# Patient Record
Sex: Male | Born: 1960 | Race: White | Hispanic: No | Marital: Married | Smoking: Never smoker
Health system: Southern US, Community
[De-identification: ages and names within clinical notes are randomized; demographics above are authoritative.]

## PROBLEM LIST (undated history)

## (undated) DIAGNOSIS — K5090 Crohn's disease, unspecified, without complications: Secondary | ICD-10-CM

## (undated) DIAGNOSIS — K5190 Ulcerative colitis, unspecified, without complications: Secondary | ICD-10-CM

## (undated) HISTORY — PX: TOTAL SHOULDER REPLACEMENT: SUR1217

## (undated) HISTORY — PX: ACHILLES TENDON SURGERY: SHX542

## (undated) HISTORY — PX: TOTAL HIP ARTHROPLASTY: SHX124

---

## 2000-09-27 ENCOUNTER — Other Ambulatory Visit: Admission: RE | Admit: 2000-09-27 | Discharge: 2000-09-27 | Payer: Self-pay | Admitting: Orthopedic Surgery

## 2001-02-06 ENCOUNTER — Encounter: Payer: Self-pay | Admitting: Internal Medicine

## 2001-02-06 ENCOUNTER — Encounter: Admission: RE | Admit: 2001-02-06 | Discharge: 2001-02-06 | Payer: Self-pay | Admitting: Internal Medicine

## 2007-02-01 ENCOUNTER — Encounter: Admission: RE | Admit: 2007-02-01 | Discharge: 2007-02-01 | Payer: Self-pay | Admitting: *Deleted

## 2007-02-02 ENCOUNTER — Inpatient Hospital Stay (HOSPITAL_COMMUNITY): Admission: AD | Admit: 2007-02-02 | Discharge: 2007-02-06 | Payer: Self-pay | Admitting: Internal Medicine

## 2007-07-04 ENCOUNTER — Encounter: Admission: RE | Admit: 2007-07-04 | Discharge: 2007-07-04 | Payer: Self-pay | Admitting: Gastroenterology

## 2007-10-06 ENCOUNTER — Emergency Department (HOSPITAL_COMMUNITY): Admission: EM | Admit: 2007-10-06 | Discharge: 2007-10-06 | Payer: Self-pay | Admitting: Emergency Medicine

## 2007-10-10 ENCOUNTER — Inpatient Hospital Stay (HOSPITAL_COMMUNITY): Admission: AD | Admit: 2007-10-10 | Discharge: 2007-10-14 | Payer: Self-pay | Admitting: Gastroenterology

## 2007-10-11 ENCOUNTER — Encounter (INDEPENDENT_AMBULATORY_CARE_PROVIDER_SITE_OTHER): Payer: Self-pay | Admitting: Gastroenterology

## 2007-12-27 ENCOUNTER — Encounter: Admission: RE | Admit: 2007-12-27 | Discharge: 2007-12-27 | Payer: Self-pay | Admitting: Gastroenterology

## 2010-03-06 ENCOUNTER — Encounter: Admission: RE | Admit: 2010-03-06 | Discharge: 2010-03-06 | Payer: Self-pay | Admitting: Gastroenterology

## 2010-07-12 ENCOUNTER — Encounter: Payer: Self-pay | Admitting: *Deleted

## 2010-10-02 ENCOUNTER — Ambulatory Visit
Admission: RE | Admit: 2010-10-02 | Discharge: 2010-10-02 | Disposition: A | Discharge status: Home / Self Care | Payer: BLUE CROSS/BLUE SHIELD | Source: Ambulatory Visit | Attending: Family Medicine | Admitting: Family Medicine

## 2010-10-02 ENCOUNTER — Other Ambulatory Visit: Payer: Self-pay | Admitting: Family Medicine

## 2010-10-02 DIAGNOSIS — J019 Acute sinusitis, unspecified: Secondary | ICD-10-CM

## 2010-11-03 NOTE — Op Note (Signed)
NAME:  [name], [name]                ACCOUNT NO.:  [id number]   MEDICAL RECORD NO.:  [id number]          PATIENT TYPE:  INP   LOCATION:  5509                         FACILITY:  [facility]   PHYSICIAN:  James L. Malon Kindle., M.D.DATE OF BIRTH:  06/19/1961   DATE OF PROCEDURE:  10/13/2007  DATE OF DISCHARGE:                               OPERATIVE REPORT   PROCEDURE:  Esophagogastroduodenoscopy.   MEDICATIONS:  1. Fentanyl 75 mcg.  2. Versed 7.5 mg.  3. Cetacaine spray.   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  He is on 6-MP steroids and had persistent nausea.  This is  done to evaluate for ulcer, inflammatory bowel disease in the upper  track etc.   DESCRIPTION OF PROCEDURE:  The procedure explained to the patient,  consent obtained. In the left lateral decubitus position.  Endoscope  inserted and advanced with agglutination.  Esophagus completely  endoscopically normal.  No ulceration, inflammation, minimal hiatal  hernia, no Barrett esophageus.  Stomach entered.  Pores was identified  and passed, mild duodenitis of the duodenal bulb, and slight  inflammation of the pores, otherwise normal.  The stomach was normal.  The antrum and body, the fundus and cardia were seen on the retroflex  view and were normal.  The scope was withdrawn and initial findings were  confirmed.   ASSESSMENT:  1. Nausea, without obvious cause found on endoscopy.  2. Mild duodenitis, we will continue on PPI's and advanced diet,      hopefully we will be able to get him home soon.           ______________________________  Llana Aliment Malon Kindle., M.D.     Waldron Session  D:  10/13/2007  T:  10/14/2007  Job:  [id number]   cc:   Bernette Redbird, M.D.  Loreli Dollar, MD

## 2010-11-03 NOTE — Discharge Summary (Signed)
[name], [name]                ACCOUNT NO.:  [id number]   MEDICAL RECORD NO.:  [id number]          PATIENT TYPE:  INP   LOCATION:  6731                         FACILITY:  [facility]   PHYSICIAN:  Kela Millin, M.D.DATE OF BIRTH:  1960/12/08   DATE OF ADMISSION:  02/02/2007  DATE OF DISCHARGE:  02/06/2007                               DISCHARGE SUMMARY   DISCHARGE DIAGNOSES:  1. Infectious enteritis versus pouchitis - resolving, per      gastroenterology.  2. History of ulcerative colitis - status post colectomy.   CONSULTATIONS:  Gastroenterology - Dr. Bosie Clos and Dr. Matthias Hughs   PROCEDURES AND STUDIES:  CT scan of abdomen and pelvis - wall thickening  identified in the distal small bowel at the level of the pelvic  anastomosis.  A stricture at this level can not be excluded.  There is  some proximal small bowel distention, although contrast is seen through  to the level of the pelvic anastomosis, this may be related to a degree  of ileus, although obstruction at the distal anastomosis secondary to a  distal stricture is a possibility.  Mild mesenteric adenopathy in the  anatomic pelvis may be reactive.   BRIEF HISTORY:  The patient is a 50 year old white male with the above-  listed medical problems and status post total colectomy in 1994, who  presented with complaints of abdominal pain.  He reported that he had  recently been out to [place] backpacking, and following that  developed abdominal pain, diarrhea and an episode of vomiting with  chills.  After he returned from his trip, he saw his primary care  physician, and a CT scan of the abdomen and pelvis were done, and the  results as stated above, he was admitted for further evaluation and  management.   Please see the patient's admission history and physical of February 02, 2007, for full details of the admission physical exam.   LABORATORY DATA:  On admission, revealed a white cell count of 5.1,  hemoglobin of  14.1, hematocrit 41.6, platelet count of 439, sodium 135,  potassium 4.4, chloride of 103, CO2 of 27, glucose of 98, BUN of 10,  creatinine of 0.99.   HOSPITAL COURSE:  1. Abdominal  pain, probably secondary to infectious gastroenteritis      versus pouchitis - The patient had been empirically started on oral      Cipro and Flagyl by his primary care physician, and a CT scan of      his abdomen obtained, and the results as stated above.  Upon      admission, the patient was maintained on Cipro and Flagyl IV, and      gastroenterology was consulted, and Dr. Bosie Clos saw the patient,      and his impression was that this was likely secondary to an      infectious gastroenteritis with a slight ileus.  Stool studies were      ordered, and Giardia screen is negative, and also the      Cryptosporidium screen is negative.  Gastroenterology followed the  patient and agreed with the Cipro and the Flagyl.  The patient's      symptoms have resolved, he is not having any further abdominal      pain.  He is tolerating p.o. well, and he is also not having any      further diarrhea.  GI saw the patient on rounds today and      recommended that he could be discharged home, and the patient      strongly desires to go home at this time, and he will be discharged      to follow up with Dr. Matthias Hughs on February 13, 2007.  He is also to      follow up with his primary care physician in 1 to 2 weeks.  2. History of ulcerative colitis - status post colectomy in 1994.  Dr.      Bosie Clos saw the patient and indicated that there is a question as      to if this is actually ulcerative colitis or Crohn's.   She is to follow up with gastroenterology, as indicated above.   DISCHARGE MEDICATIONS:  1. Cipro 500 mg p.o. b.i.d.  2. Flagyl 500 mg one p.o. t.i.d.  3. The patient has been instructed to hold off Imodium and Celebrex,      until he follows up with gastroenterology.   DISCHARGE CONDITION:   Improved/stable.   FOLLOW-UP CARE:  1. Dr. Matthias Hughs on Monday, August 25th at 9:30 a.m.  2. Dr. Flonnie Overman in 1 to 2 weeks.  Patient to call for appointment.      Kela Millin, M.D.  Electronically Signed     ACV/MEDQ  D:  02/06/2007  T:  02/06/2007  Job:  [id number]   cc:   Lucita Ferrara, MD  Bernette Redbird, M.D.

## 2010-11-03 NOTE — H&P (Signed)
NAMEZEDEKIAH, HINDERMAN                ACCOUNT NO.:  [id number]   MEDICAL RECORD NO.:  [id number]          PATIENT TYPE:  INP   LOCATION:  6731                         FACILITY:  [facility]   PHYSICIAN:  Kela Millin, M.D.DATE OF BIRTH:  [birth year]   DATE OF ADMISSION:  02/02/2007  DATE OF DISCHARGE:                              HISTORY & PHYSICAL   His primary care physician is Dr. Flonnie Overman.   CHIEF COMPLAINT:  Abdominal pain and abnormal CT scan per PCP.   HISTORY OF PRESENT ILLNESS:  The patient is a 50 year old white male  with past medical history significant for ulcerative colitis status post  colectomy in 1994 (at [facility]) who presents with complaints of abdominal  pain times 1 week.  He states that his symptoms started after he went  backpacking in [place].  He reports that initially he had some  associated diarrhea, about 5 stools a day, but that has resolved.  He  describes the abdominal as sharp and constant but periodically gets more  intense, usually 7-8 out of 10 in intensity, and goes up to 10.  He  reports that the pain is usually with about 30-40 minutes after meals.  He admits to nausea and just had 1 episode of vomiting in the one week.  He states that even when he had the diarrhea it was nonbloody.  He  admits to subjective fevers.  He denies cough, shortness of breath,  dysuria, melena, and no hematochezia.  The patient was seen by his  primary care physician and empirically started on Cipro and Flagyl on  the day prior to admission.  A CT scan of the abdomen was also ordered  and, per PCP, a stricture cannot be excluded and also question  obstruction.  He is directly admitted to the hospital for further  evaluation and management.   PAST MEDICAL HISTORY:  As above.   MEDICATIONS:  Celebrex and Imodium.   ALLERGIES:  PENICILLIN AND IBUPROFEN.   SOCIAL HISTORY:  He denies tobacco.  Also denies alcohol.   FAMILY HISTORY:  Noncontributory.   REVIEW OF SYSTEMS:  As  per HPI.   PHYSICAL EXAM:  GENERAL:  The patient is a middle-aged white male in no  apparent distress.  VITAL SIGNS:  His temperature is 98.4 with a pulse of 80, respiratory  rate of 18, blood pressure of 123/75.  HEENT:  PERRL.  EOMI.  Sclerae anicteric.  Moist mucus membranes and no  oral exudates.  NECK:  Supple.  No adenopathy.  No thyromegaly.  LUNGS:  Clear to auscultation bilaterally.  No crackles or wheezes.  CARDIOVASCULAR:  Regular rate and rhythm. Normal S1-S2.  ABDOMEN:  Soft.  Bowel sounds present.  Tenderness to the left of his  umbilicus, left upper quadrant.  No rebound tenderness.  No organomegaly  and no masses palpable.  EXTREMITIES:  No cyanosis and no edema.  NEURO:  Is alert and oriented x3.  Cranial nerves II through XII are  grossly intact.   LABORATORY DATA:  CT scan as above.  Verbal report from PCP.  Labs  ordered  pending.   ASSESSMENT AND PLAN:  1. Abdominal pain, as discussed above, with CT scan revealing possible      obstruction and stricture in a patient with ulcerative colitis and      symptoms that began while he was in [place].  Will place on IV      Cipro and Flagyl, consult Gastroenterology, patient followed by Dr.      Laural Benes, Gastroenterologist.  Will follow the CMET, CBC, urinalysis      that are pending.  2. Ulcerative colitis.  Status post colectomy with anastomosis, as      above.      Kela Millin, M.D.  Electronically Signed     ACV/MEDQ  D:  02/03/2007  T:  02/03/2007  Job:  [id number]   cc:   Lucita Ferrara, MD  Danise Edge, M.D.

## 2010-11-03 NOTE — Op Note (Signed)
NAME:  [name], [name]                ACCOUNT NO.:  [id number]   MEDICAL RECORD NO.:  [id number]           PATIENT TYPE:   LOCATION:                                 FACILITY:   PHYSICIAN:  James L. Malon Kindle., M.D.DATE OF BIRTH:  25-Sep-1960   DATE OF PROCEDURE:  DATE OF DISCHARGE:                               OPERATIVE REPORT   PROCEDURE:  Sigmoidoscopy and biopsy.   MEDICATIONS:  1. Fentanyl 50 mcg.  2. Versed 5 mg IV.   SCOPE:  Pentax pediatric scope.   INDICATIONS:  A very nice gentleman who has come in with fever, erythema  nodosum, no real abdominal pain, but has a history of a ileoanal pull-  through due to a subtotal colectomy due to ulcerative colitis with a  recent pouchitis.  The exact cause of these fevers and symptoms are  unclear, and for this reason an unprepped sigmoidoscopy was performed.   DESCRIPTION OF PROCEDURE:  The procedure had been explained to the  patient.  In the left lateral decubitus position, we did give him some  IV sedation.  The Pentax scope was inserted.  He had a reasonable amount  of liquid stool in the pouch which was suctioned clear.  There was dual  anastomosis with attachment of the ileum to the pouch.  It was markedly  ulcerated.  I did not try to enter either pouch, but could see well  interim there was no obstruction.  The scope probably would have passed.  I just did not want to put any pressure on this area.  There was a  confluent ulceration from 1 lumen to the other.  A couple of biopsies  were taken at the edges with some bleeding.  The remainder of the pouch  was examined.  The mucosa appeared normal and no further signs of  pouchitis anywhere else other than at the anastomosis.  The scope was  withdrawn, and the patient was resting comfortably at the termination of  the procedure.   ASSESSMENT:  Confluent ulceration and anastomosis with the patient's  ileum and pouch.  I plan to biopsy this.  We will go ahead with the CT  enterography.   PLAN:  Since he has not really responded in the court, we will likely  change him over to prednisone.           ______________________________  Llana Aliment. Malon Kindle., M.D.     Waldron Session  D:  10/11/2007  T:  10/12/2007  Job:  [id number]   cc:   Bernette Redbird, M.D.  Lavonda Jumbo, M.D.  [facility]

## 2010-11-03 NOTE — Consult Note (Signed)
NAMECORRADO, HYMON NO.:  [id number]   MEDICAL RECORD NO.:  [id number]          PATIENT TYPE:  INP   LOCATION:  6731                         FACILITY:  [facility]   PHYSICIAN:  Shirley Friar, MDDATE OF BIRTH:  [birth year]   DATE OF CONSULTATION:  02/03/2007  DATE OF DISCHARGE:                                 CONSULTATION   REFERRING PHYSICIAN:  Kela Millin, M.D.   REASON FOR CONSULTATION:  Abdominal pain.   HISTORY OF PRESENT ILLNESS:  This is a 50 year old male with a history  of ulcerative colitis.  He is status post total colectomy in 1994.  Mr.  Abalos tells me that he has been healthy for the last 5 years.  Occasionally, prior to 2003, he had bouts with pouchitis.  He was  backpacking in [place] recently and developed abdominal pain,  diarrhea and one episode of severe vomiting with chills.  He returned  from his trip on August 12, saw a doctor on August 13, and a CT scan was  done.  He was admitted to [facility] approximately 24 hours ago and has  been n.p.o. on intravenous fluids with Cipro and Flagyl since.  His pain  is somewhat improved.  His fever and chills appear to have  resolved.  His bowel movements are currently loose and four times per day.  The  patient denies any hematochezia, hematemesis or melena.  He states that  he does have occasional heartburn for which she takes Pepcid twice a  week.  He does not use any NSAIDs.   PAST MEDICAL HISTORY:  1. Ulcerative colitis.  His last colonoscopy was approximately 10      years ago done by Dr. Epifania Gore at [facility].  2. Partial shoulder replacement done in 1996.  3. Total hip replacement done in 1995.   PRIMARY CARE PHYSICIAN:  Dr. Flonnie Overman.   CURRENT MEDICATIONS:  Celebrex, Imodium p.r.n., Pepcid AC approximately  twice a week, multivitamins, Echinacea with gold seal, many different  types of vitamins as well as Tylenol p.r.n.   ALLERGIES:  PENICILLIN AND IBUPROFEN.  Ibuprofen makes  his eyes swell.   FAMILY HISTORY:  He states that his great-grandmother had a colostomy  and they believed that she had ulcerative colitis.   SOCIAL HISTORY:  He drinks an occasional beer.  No tobacco.  No drugs.   PHYSICAL EXAMINATION:  GENERAL:  He is alert and oriented in no apparent  distress.  He is well-developed, well-nourished.  HEENT:  No icterus or jaundice.  CARDIAC:  Regular rate and rhythm with no murmurs, rubs or gallops  appreciated.  LUNGS:  Clear to auscultation with no wheezes or crackles.  ABDOMEN:  Soft, has good bowel sounds, but is tender in the left lower  quadrant.  VITAL SIGNS:  Temperature is 98, pulse 58, respirations are 16, blood  pressure is 105/64.   LABORATORY DATA AND X-RAY FINDINGS:  Hemoglobin 13.3, hematocrit 39,  white count 4.3, platelets 385.  His Chem 7 is within normal limits.  CT  scan done on August 13, shows distended loops  of small bowel in the left  upper quadrant, questions an ileus and distal small bowel thickening.   ASSESSMENT:  Dr. Charlott Rakes has seen and examined the patient and  collected a history.  His impression is that this was possibly  gastroenteritis which may be followed by slight ileus.  The history is  not consistent with pouchitis.  Suspect viral/bacterial gastroenteritis.   RECOMMENDATIONS:  Agree with Cipro and Flagyl.  Will give a trial of  clear liquids.  Check stool studies. Will give supportive care and hold  off on a pouchoscopy unless he fails to respond to supportive care.   Thank you very much for this consultation.      Stephani Police, [place]      Shirley Friar, MD  Electronically Signed    MLY/MEDQ  D:  02/03/2007  T:  02/04/2007  Job:  [id number]

## 2010-11-03 NOTE — H&P (Signed)
NAME:  [name], [name]                ACCOUNT NO.:  [id number]   MEDICAL RECORD NO.:  [id number]          PATIENT TYPE:  INP   LOCATION:  5509                         FACILITY:  [facility]   PHYSICIAN:  [name], M.D. DATE OF BIRTH:  March 13, 1961   DATE OF ADMISSION:  10/10/2007  DATE OF DISCHARGE:                              HISTORY & PHYSICAL   ADMITTING DIAGNOSES:  1. Fever of unknown origins.  2. Vomiting.  3. Arthralgia.  4. Long-term history of irritable bowel disease.   HISTORY OF PRESENT ILLNESS:  Mr. [name] is a 50 year old male who is a  very active gentleman who has had ulcerative colitis since 1988.  He had  a total colectomy with ileoanal anastomosis and pouch formation in 1994.  He has been under recent evaluation by the GI division at [facility]  because of symptoms that were suggestive of pouchitis.  He has been on  Cipro and Flagyl b.i.d. since January 26 for this.  He underwent balloon  dilation of a stenosis of one of the limbs of his pouch in March and  since that time, has had three episodes of relatively severe GI  bleeding.  His last episode was on September 28, 2007.  He has had no GI  bleeding since, he was started on 6-MP 3 weeks ago, when it was thought  that his ulcerative colitis may have evolved into Crohn's disease.   Approximately 5 days ago, he developed gradual onset of low-grade  fevers, ashiness, malaise, decreased energies.  He noted that his  shoulders and his knees ache.  He developed a low-grade fever of 101 and  went to the emergency room last Friday, October 06, 2007.  His labs in the  emergency room were normal.  He was diagnosed with fever and released.  He felt better over the weekend and then Monday, his symptoms recurred.  He had a fever of 101.  He developed severe pain in his knees and  shoulders and felt very fatigued and had several episodes of vomitings  without hematemesis.  He reports that his bowel movements have been  normal for the  last week or two, normal for him is approximately 6 soft,  but formed bowel movements per day without any melena or hematochezia.  He saw Dr. Bernette Redbird at the [facility] GI office yesterday, October 09, 2007.  At that point, his labs were still normal, but it was obvious  that he was ill.  He had developed erythema nodosum on his lower  extremities bilaterally.  Dr. Matthias Hughs spoke with his gastroenterologist  at [facility] Dr. Loreli Dollar.  They agreed that this is possibly  an extra intestinal flare of Crohn's disease and considered doubling the  6-MP, but because the patient's symptoms have become so severe, they  felt that it was best he be hospitalized to rule out any infectious or  viral disease process before his steroids and 6-MP were increased.  His  wife tells me that he has had no sick contacts, but she had bronchitis  approximately 3 weeks ago.  PAST MEDICAL HISTORY:  Significant for ulcerative colitis and recurrent  pouchitis.  He is status post colectomy.  He had a shoulder replacement  secondary to steroid use.  He had a hip replacement secondary to steroid  use with a revision done in December 2008.  His recovery was complicated  by postop ileus.  His last colonoscopy was done at [facility] in  March, 2009.  The report is on the chart.  His primary care physician is  Dr. Joselyn Arrow at [place] [facility].   CURRENT MEDICATIONS:  1. Cipro 500 mg b.i.d.  2. Flagyl 500 mg b.i.d. as previously mentioned.  He has been on these      since July 17, 2007.  3. He is also on Entocort 9 mg nightly.  4. He is on 6-MP 50 mg once a day which was started 3 weeks ago.   ALLERGIES:  He has allergies to PENICILLIN and IBUPROFEN.   REVIEW OF SYSTEMS:  Significant for severe arthralgias, fever, fatigue,  and malaise.  He apparently had no lower GI symptoms such as abdominal  pain or change in bowel habits.   SOCIAL HISTORY:  He has rare alcohol.  No tobacco.  No drugs.    FAMILY HISTORY:  Significant for his great grandmother who had a  colostomy and possibly had IDD.   Labs done yesterday at the [facility] GI office show an amylase of 90, lipase  67, white count 9.1, hemoglobin 13.8, hematocrit 42.5, platelets 318.  LFTs were normal.  BMET is normal.  Radiological exams, we have ordered  a CT enterography and hope it will be done today.   ASSESSMENT:  Dr. Carman Ching has seen and examined the patient and  collected a history.   PLAN:  Our plan is to go ahead and admit him with a possible diagnosis  of extraintestinal Crohn's disease versus other infectious process and  we will start IV hydration, order a CT enterography, check a chest x-ray  as well as urinary analysis for possible infection.  We will order PPD,  sed rate, and ask infectious disease for their opinion in this case.  After he has been cleared of any infectious or viral process, may  increase 6-MP and add steroids in order to calm down the possible  Crohn's flare.      Stephani Police, PA    ______________________________  Llana Aliment Randa Perry, M.D.    MLY/MEDQ  D:  10/10/2007  T:  10/11/2007  Job:  [id number]   cc:   Bernette Redbird, M.D.  Dr. Loreli Dollar

## 2010-11-06 NOTE — Discharge Summary (Signed)
NAME:  [name], [name]                ACCOUNT NO.:  [id number]   MEDICAL RECORD NO.:  [id number]          PATIENT TYPE:  INP   LOCATION:  5509                         FACILITY:  [facility]   PHYSICIAN:  James L. Randa Evens, M.D. DATE OF BIRTH:  1960/12/11   DATE OF ADMISSION:  10/10/2007  DATE OF DISCHARGE:  10/14/2007                               DISCHARGE SUMMARY   ADMISSION DIAGNOSES:  1. Long-term history of inflammatory bowel disease.  2. Fever of unknown origin.  3. Vomiting.  4. Arthralgia.   DISCHARGE DIAGNOSIS:  Inflammatory bowel disease flare.   CONSULTANTS:  None.   PROCEDURES:  1. On October 11, 2007, flexible sigmoidoscopy with biopsy by Dr. Carman Ching, who found confluent ulcerations with anastomosis of the      patient's ileum and pouch.  Biopsies were taken that revealed      simply inflammations.  2. October 13, 2007, upper endoscopy by Dr. Carman Ching, which      revealed mild duodenitis.  Radiological exam, CT enterography on      October 10, 2007.   FINDINGS:  1. Stable right mesenteric lymph node.  2. Mild narrowing at the anastomotic site without obstruction or      abscess.   HISTORY AND PHYSICAL:  [name] is a 50 year old gentleman, who is  status post total colectomy in 1994 for ulcerative colitis.  He has an  ileoanal anastomosis and pouch formation.  Because of recent recurrent  pouchitis, he had been evaluated at [facility] by Dr.  Loreli Dollar.  He has been on Cipro and Flagyl since July 17, 2007 for  this.  He also underwent balloon dilatation of a stenosis of one of the  limbs of his pouch in March.  Since that time, he has had three episodes  of GI bleeding.  He was started on 6-MP exactly 3 weeks ago and it was  felt that his ulcerative colitis could have possibly evolved into  Crohn's.  Five days before his presentation at [facility], he  developed low grade fevers, achiness in his knees and back, malaise, and  decreased energy.  Subsequently, he developed vomiting and a fever of  over 101.  Physical exam and initial presentation was significant for  severe arthralgias in his knees and shoulders as well as erythema  nodosum on his lower extremities bilaterally.  Initial serologies  revealed a positive antinuclear antibody with a 1:40 ANA titer and a  speckled pattern.   HOSPITAL COURSE:  In order to be certain that the patient's symptoms  were caused by an extraintestinal manifestation of IBD rather than an  infectious etiology, chest x-ray, urinary analysis, and CT scan were  done before the patient was started on IV steroids.  Once he was  started, he showed quick improvement including decreased pain, decreased  erythema nodosum.  He did continue to have nausea, for which an upper  endoscopy was done on the October 13, 2007, it was essentially negative  other than some mild gastritis.  The patient quickly improved.  He was  able to tolerate the diet and ambulated well.  He was discharged to home  on October 14, 2007.   DISCHARGE MEDICATIONS:  Included:  1. Cipro 500 mg b.i.d.  2. Flagyl 500 mg b.i.d.  3. Prilosec 20 mg p.r.n.  4. 6-MP 100 mg per day.  5. Prednisone 30 mg 2 times a day for 10 days.  6. Multivitamin per day.   DISCHARGE INSTRUCTIONS:  The patient was instructed to call [facility] if  his previous symptoms of fever or arthralgias returned and worsened or  if he developed nausea, vomiting, or diarrhea.  He was also instructed  to contact Dr. Matthias Hughs in order to establish communication with Dr.  Loreli Dollar to determine how long he needed to stay on his antibiotics  and to check his 6-MP level.      Noah Police, PA    ______________________________  Llana Aliment Randa Evens, M.D.    MLY/MEDQ  D:  10/17/2007  T:  10/18/2007  Job:  [id number]   cc:   Bernette Redbird, M.D.  Lavonda Jumbo, M.D.  Loreli Dollar, M.D.

## 2011-03-16 LAB — LIPASE, BLOOD
Lipase: 23
Lipase: 28

## 2011-03-16 LAB — CBC
HCT: 38 — ABNORMAL LOW
HCT: 39.8
HCT: 40.2
HCT: 40.5
HCT: 44
Hemoglobin: 13
Hemoglobin: 13.5
Hemoglobin: 13.5
Hemoglobin: 14.8
MCHC: 33.4
MCHC: 33.6
MCV: 85.1
MCV: 85.5
MCV: 85.9
Platelets: 278
Platelets: 376
RBC: 4.71
RDW: 15.9 — ABNORMAL HIGH
RDW: 16.2 — ABNORMAL HIGH
RDW: 16.2 — ABNORMAL HIGH
WBC: 10.8 — ABNORMAL HIGH
WBC: 5.8
WBC: 6.9

## 2011-03-16 LAB — BASIC METABOLIC PANEL
BUN: 10
BUN: 12
CO2: 26
Calcium: 8.5
Calcium: 8.7
Chloride: 103
Creatinine, Ser: 0.92
GFR calc non Af Amer: >60
GFR calc non Af Amer: >60
GFR calc non Af Amer: >60
Glucose, Bld: 136 — ABNORMAL HIGH
Glucose, Bld: 90
Potassium: 4
Potassium: 4.1
Sodium: 136
Sodium: 138

## 2011-03-16 LAB — ANA: Anti Nuclear Antibody(ANA): POSITIVE — AB

## 2011-03-16 LAB — COMPREHENSIVE METABOLIC PANEL
Albumin: 3.6
BUN: 7
Chloride: 102
Creatinine, Ser: 0.98
GFR calc non Af Amer: >60
Glucose, Bld: 93
Total Bilirubin: 0.7

## 2011-03-16 LAB — MPO/PR-3 (ANCA) ANTIBODIES
Myeloperoxidase Abs: <9 U/mL (ref <9.0)
Serine Protease 3: <3.5 U/mL (ref <3.5)

## 2011-03-16 LAB — URINALYSIS, ROUTINE W REFLEX MICROSCOPIC
Ketones, ur: 15 — AB
Nitrite: NEGATIVE
Specific Gravity, Urine: 1.027
Urobilinogen, UA: 0.2
pH: 5

## 2011-03-16 LAB — CULTURE, BLOOD (ROUTINE X 2)
Culture: NO GROWTH
Culture: NO GROWTH
Culture: NO GROWTH

## 2011-03-16 LAB — URINE MICROSCOPIC-ADD ON

## 2011-03-16 LAB — ANTI-NUCLEAR AB-TITER (ANA TITER)

## 2011-03-16 LAB — DIFFERENTIAL
Basophils Absolute: 0
Basophils Relative: 0
Lymphocytes Relative: 13
Monocytes Absolute: 0.6
Neutro Abs: 5.2
Neutrophils Relative %: 76

## 2011-04-02 LAB — DIFFERENTIAL
Eosinophils Relative: 6 — ABNORMAL HIGH
Lymphocytes Relative: 33
Lymphs Abs: 1.4
Neutrophils Relative %: 49

## 2011-04-02 LAB — FECAL LACTOFERRIN, QUANT

## 2011-04-02 LAB — CBC
HCT: 37.8 — ABNORMAL LOW
HCT: 39
Hemoglobin: 12.8 — ABNORMAL LOW
MCV: 87.8
Platelets: 385
RBC: 4.3
WBC: 3.3 — ABNORMAL LOW
WBC: 4.3

## 2011-04-02 LAB — BASIC METABOLIC PANEL
BUN: 6
BUN: 8
Calcium: 8.4
Chloride: 109
Creatinine, Ser: 1.04
GFR calc Af Amer: >60
GFR calc Af Amer: >60
GFR calc non Af Amer: >60
Potassium: 3.7
Potassium: 3.9
Sodium: 140

## 2011-04-02 LAB — SEDIMENTATION RATE: Sed Rate: 8

## 2011-04-05 LAB — URINALYSIS, ROUTINE W REFLEX MICROSCOPIC
Glucose, UA: NEGATIVE
Ketones, ur: 15 — AB
Specific Gravity, Urine: 1.03
pH: 5.5

## 2011-04-05 LAB — URINE CULTURE

## 2011-04-05 LAB — CBC
MCHC: 34
RDW: 13.6

## 2011-04-05 LAB — BASIC METABOLIC PANEL
CO2: 27
Calcium: 8.3 — ABNORMAL LOW
Creatinine, Ser: 0.99
GFR calc Af Amer: >60
Glucose, Bld: 98

## 2011-04-05 LAB — DIFFERENTIAL
Basophils Absolute: 0
Basophils Relative: 0
Neutro Abs: 3.1
Neutrophils Relative %: 61

## 2013-02-27 ENCOUNTER — Emergency Department (HOSPITAL_COMMUNITY)
Admission: EM | Admit: 2013-02-27 | Discharge: 2013-02-27 | Disposition: A | Discharge status: Home / Self Care | Payer: BC Managed Care – PPO | Attending: Emergency Medicine | Admitting: Emergency Medicine

## 2013-02-27 ENCOUNTER — Encounter (HOSPITAL_COMMUNITY): Payer: Self-pay

## 2013-02-27 ENCOUNTER — Emergency Department (HOSPITAL_COMMUNITY): Payer: BC Managed Care – PPO

## 2013-02-27 DIAGNOSIS — Z79899 Other long term (current) drug therapy: Secondary | ICD-10-CM | POA: Insufficient documentation

## 2013-02-27 DIAGNOSIS — K5900 Constipation, unspecified: Secondary | ICD-10-CM | POA: Insufficient documentation

## 2013-02-27 DIAGNOSIS — Z8719 Personal history of other diseases of the digestive system: Secondary | ICD-10-CM | POA: Insufficient documentation

## 2013-02-27 DIAGNOSIS — IMO0002 Reserved for concepts with insufficient information to code with codable children: Secondary | ICD-10-CM | POA: Insufficient documentation

## 2013-02-27 DIAGNOSIS — N201 Calculus of ureter: Secondary | ICD-10-CM | POA: Insufficient documentation

## 2013-02-27 HISTORY — DX: Ulcerative colitis, unspecified, without complications: K51.90

## 2013-02-27 HISTORY — DX: Crohn's disease, unspecified, without complications: K50.90

## 2013-02-27 LAB — COMPREHENSIVE METABOLIC PANEL
AST: 17 U/L (ref 0–37)
Albumin: 3.4 g/dL — ABNORMAL LOW (ref 3.5–5.2)
BUN: 15 mg/dL (ref 6–23)
Calcium: 9.1 mg/dL (ref 8.4–10.5)
Chloride: 103 mEq/L (ref 96–112)
Creatinine, Ser: 0.99 mg/dL (ref 0.50–1.35)
Total Bilirubin: 0.5 mg/dL (ref 0.3–1.2)
Total Protein: 7.4 g/dL (ref 6.0–8.3)

## 2013-02-27 LAB — CBC WITH DIFFERENTIAL/PLATELET
Basophils Absolute: 0 10*3/uL (ref 0.0–0.1)
Basophils Relative: 0 % (ref 0–1)
Eosinophils Absolute: 0 10*3/uL (ref 0.0–0.7)
HCT: 45.3 % (ref 39.0–52.0)
MCH: 32.2 pg (ref 26.0–34.0)
MCHC: 36.6 g/dL — ABNORMAL HIGH (ref 30.0–36.0)
Monocytes Absolute: 0.8 10*3/uL (ref 0.1–1.0)
Neutro Abs: 11.2 10*3/uL — ABNORMAL HIGH (ref 1.7–7.7)
RDW: 12.8 % (ref 11.5–15.5)

## 2013-02-27 LAB — URINALYSIS, ROUTINE W REFLEX MICROSCOPIC
Glucose, UA: NEGATIVE mg/dL
Leukocytes, UA: NEGATIVE
Specific Gravity, Urine: 1.027 (ref 1.005–1.030)
pH: 5.5 (ref 5.0–8.0)

## 2013-02-27 LAB — URINE MICROSCOPIC-ADD ON

## 2013-02-27 LAB — LIPASE, BLOOD: Lipase: 26 U/L (ref 11–59)

## 2013-02-27 LAB — LACTIC ACID, PLASMA: Lactic Acid, Venous: 2.5 mmol/L — ABNORMAL HIGH (ref 0.5–2.2)

## 2013-02-27 MED ORDER — IOHEXOL 300 MG/ML  SOLN
50.0000 mL | Freq: Once | INTRAMUSCULAR | Status: AC | PRN
  Administered 2013-02-27: 50 mL via ORAL

## 2013-02-27 MED ORDER — OXYCODONE-ACETAMINOPHEN 5-325 MG PO TABS
1.0000 | ORAL_TABLET | Freq: Once | ORAL | Status: DC
  Filled 2013-02-27: qty 1

## 2013-02-27 MED ORDER — HYDROMORPHONE HCL PF 1 MG/ML IJ SOLN
1.0000 mg | Freq: Once | INTRAMUSCULAR | Status: AC
  Administered 2013-02-27: 1 mg via INTRAVENOUS
  Filled 2013-02-27: qty 1

## 2013-02-27 MED ORDER — IOHEXOL 300 MG/ML  SOLN
80.0000 mL | Freq: Once | INTRAMUSCULAR | Status: AC | PRN
  Administered 2013-02-27: 80 mL via INTRAVENOUS

## 2013-02-27 MED ORDER — SODIUM CHLORIDE 0.9 % IV BOLUS (SEPSIS)
1000.0000 mL | Freq: Once | INTRAVENOUS | Status: AC
  Administered 2013-02-27: 1000 mL via INTRAVENOUS

## 2013-02-27 MED ORDER — KETOROLAC TROMETHAMINE 30 MG/ML IJ SOLN
30.0000 mg | Freq: Once | INTRAMUSCULAR | Status: AC
  Administered 2013-02-27: 30 mg via INTRAVENOUS
  Filled 2013-02-27: qty 1

## 2013-02-27 MED ORDER — ONDANSETRON HCL 4 MG/2ML IJ SOLN
4.0000 mg | Freq: Once | INTRAMUSCULAR | Status: AC
  Administered 2013-02-27: 4 mg via INTRAVENOUS
  Filled 2013-02-27: qty 2

## 2013-02-27 MED ORDER — ONDANSETRON HCL 4 MG PO TABS: 4.0000 mg | ORAL_TABLET | Freq: Four times a day (QID) | ORAL | Status: DC

## 2013-02-27 MED ORDER — HYDROCODONE-ACETAMINOPHEN 5-325 MG PO TABS
2.0000 | ORAL_TABLET | Freq: Once | ORAL | Status: AC
  Administered 2013-02-27: 2 via ORAL
  Filled 2013-02-27: qty 2

## 2013-02-27 MED ORDER — HYDROCODONE-ACETAMINOPHEN 5-325 MG PO TABS: 1.0000 | ORAL_TABLET | ORAL | Status: DC | PRN

## 2013-02-27 NOTE — ED Notes (Signed)
Wofford MD at bedside. 

## 2013-02-27 NOTE — ED Notes (Signed)
Patient presents to ED via POV. Pt c/o of RLQ abdominal pain that started at approc 0430 this am. Pt states "I do not have a large intestine and I have chrons disease." Pt states that he has vomited x2 this am. Denies any fever or chills, also denies any diarrhea. Pt A&Ox4 upon arrival to ED.

## 2013-02-27 NOTE — ED Notes (Signed)
Wofford, MD at bedside to speak with patient.

## 2013-02-27 NOTE — ED Notes (Signed)
Pt requesting to switch pain meds to vicodin. MD made aware.

## 2013-02-27 NOTE — ED Provider Notes (Signed)
CSN: [id number]     Arrival date & time 02/27/13  4098 History   First MD Initiated Contact with Patient 02/27/13 0703     Chief Complaint  Patient presents with  . Abdominal Pain   (Consider location/radiation/quality/duration/timing/severity/associated sxs/prior Treatment) Patient is a 52 y.o. male presenting with abdominal pain.  Abdominal Pain Pain location:  RLQ Pain quality: pressure and stabbing   Pain radiates to:  Does not radiate Pain severity:  Severe Onset quality:  Sudden Duration:  4 hours Timing:  Constant (with waves of pain) Progression:  Unchanged Chronicity:  New Context comment:  Taking cipro colitis, taking valtrex for shingles Relieved by:  Nothing Worsened by:  Movement, position changes and bowel movements Ineffective treatments:  None tried Associated symptoms: constipation, nausea and vomiting   Associated symptoms: no chest pain, no cough, no diarrhea, no fever, no hematuria and no shortness of breath     Past Medical History  Diagnosis Date  . Ulcerative colitis   . Crohn disease    Past Surgical History  Procedure Laterality Date  . Total hip arthroplasty Right   . Total shoulder replacement Right    No family history on file. History  Substance Use Topics  . Smoking status: Never Smoker   . Smokeless tobacco: Never Used  . Alcohol Use: Yes    Review of Systems  Constitutional: Negative for fever.  HENT: Negative for congestion.   Respiratory: Negative for cough and shortness of breath.   Cardiovascular: Negative for chest pain.  Gastrointestinal: Positive for nausea, vomiting, abdominal pain and constipation. Negative for diarrhea.  Genitourinary: Negative for hematuria.  All other systems reviewed and are negative.    Allergies  Review of patient's allergies indicates no known allergies.  Home Medications   Current Outpatient Rx  Name  Route  Sig  Dispense  Refill  . ciprofloxacin (CIPRO) 500 MG tablet   Oral   Take 500 mg  by mouth 2 (two) times daily.         Marland Kitchen HYDROcodone-acetaminophen (NORCO) 7.5-325 MG per tablet   Oral   Take 1 tablet by mouth every 4 (four) hours.         . predniSONE (DELTASONE) 20 MG tablet   Oral   Take 20 mg by mouth daily.         . valACYclovir (VALTREX) 1000 MG tablet   Oral   Take 1,000 mg by mouth 2 (two) times daily.          BP 137/75  Pulse 59  Temp(Src) 98.8 F (37.1 C) (Oral)  Resp 16  Ht 6' (1.829 m)  Wt 195 lb (88.451 kg)  BMI 26.44 kg/m2  SpO2 97% Physical Exam  Nursing note and vitals reviewed. Constitutional: He is oriented to person, place, and time. He appears well-developed and well-nourished. No distress.  HENT:  Head: Normocephalic and atraumatic.  Mouth/Throat: Oropharynx is clear and moist.  Eyes: Conjunctivae are normal. Pupils are equal, round, and reactive to light. No scleral icterus.  Neck: Neck supple.  Cardiovascular: Normal rate, regular rhythm, normal heart sounds and intact distal pulses.   No murmur heard. Pulmonary/Chest: Effort normal and breath sounds normal. No stridor. No respiratory distress. He has no wheezes. He has no rales.  Abdominal: Soft. He exhibits no distension. There is tenderness in the right lower quadrant. There is no rigidity, no rebound and no guarding. Hernia confirmed negative in the right inguinal area and confirmed negative in the left inguinal area.  Genitourinary: Penis normal. Right testis shows no mass and no tenderness. Left testis shows no mass and no tenderness.  Musculoskeletal: Normal range of motion. He exhibits no edema.  Neurological: He is alert and oriented to person, place, and time.  Skin: Skin is warm and dry. No rash noted.  Psychiatric: He has a normal mood and affect. His behavior is normal.    ED Course  Procedures (including critical care time) Labs Review Labs Reviewed  CBC WITH DIFFERENTIAL - Abnormal; Notable for the following:    WBC 13.5 (*)    MCHC 36.6 (*)     Neutrophils Relative % 83 (*)    Neutro Abs 11.2 (*)    Lymphocytes Relative 11 (*)    All other components within normal limits  COMPREHENSIVE METABOLIC PANEL - Abnormal; Notable for the following:    Glucose, Bld 175 (*)    Albumin 3.4 (*)    All other components within normal limits  URINALYSIS, ROUTINE W REFLEX MICROSCOPIC - Abnormal; Notable for the following:    Hgb urine dipstick TRACE (*)    All other components within normal limits  LACTIC ACID, PLASMA - Abnormal; Notable for the following:    Lactic Acid, Venous 2.5 (*)    All other components within normal limits  LIPASE, BLOOD  URINE MICROSCOPIC-ADD ON   Imaging Review Ct Abdomen Pelvis W Contrast  02/27/2013   *RADIOLOGY REPORT*  Clinical Data: Pain  CT ABDOMEN AND PELVIS WITH CONTRAST  Technique:  Multidetector CT imaging of the abdomen and pelvis was performed following the standard protocol during bolus administration of intravenous contrast. Oral contrast was also administered.  Contrast: 80mL OMNIPAQUE IOHEXOL 300 MG/ML  SOLN  Comparison:  March 06, 2010  Findings: Lung bases are clear.  There is a small hiatal hernia.  No focal liver lesions are identified.  There is no biliary duct dilatation.  Spleen, pancreas, and adrenals appear normal.  Left kidney appears normal with the exception of a 6 mm cyst in the anterior upper pole left kidney.  There is no hydronephrosis on the left.  The right kidney appears edematous with moderate hydronephrosis.  There is no right-sided renal mass or calculus. There is a calculus in the distal right ureter measuring 2 mm, seen on axial slice 84.  No other ureteral calculi identified.  In the pelvis, there is evidence of previous surgery with an ileoanal of mass stenosis. Mild dilatation of the ileal pouch in the lower pelvis is stable.  There are multiple loops of ileum showing wall thickening consistent with ileitis.  No fistulae are identified.  There is no bowel obstruction.  No free air  or portal venous air.  There is no ascites, adenopathy, or abscess in the abdomen or pelvis.  Aorta is nonaneurysmal.  There is a total hip replacement on the right.  There are no blastic or lytic bone lesions.  IMPRESSION: 2 mm calculus distal right ureter causing moderate hydronephrosis on the right.  Right kidney is somewhat edematous.  She is status post colectomy with ileoanal mass stenosis.  There is postoperative change in the region of the ileal pouch, stable. There are multiple loops of ileum showing wall thickening consistent with ileitis.  This thickening is probably secondary to Crohn disease.  No fistulae or bowel obstruction seen.  No abscess.  There is a small hiatal hernia.  There is a total hip prosthesis on the right.   Original Report Authenticated By: Bretta Bang, M.D.  All radiology  studies independently viewed by me.     MDM   1. Ureteral stone    52 yo male w IBD and prior colectomy presenting with RLQ abdominal pain.  Feels that this is different than typical flare ups.  Last typical bowel movement was 9 hours ago.  Plan labs and CT scan. Possibly related to IBD, but could also be kidney stone (no hx of this).  Symptom control with IV dilaudid and zofran.    CT shows 2mm distal ureteral stone.  Pain not controlled after 2 rounds of IV dilaudid.  Will add Toradol and reassess.    Pain eventually controlled.  Tolerated PO.  Well appearing at time of discharge.   Candyce Churn, MD 02/28/13 [phone]

## 2013-02-27 NOTE — ED Notes (Signed)
Pt requesting more pain medicine sts pain is really bad. Pt sts he feels the Toradol was more helpful and in the past hydrocodone has worked for him.

## 2013-02-27 NOTE — ED Notes (Signed)
Pt requested more pain medication. EDP notified and no new orders at this time. EDP said he wanted to talk to pt about pain management. Pt updated on plan of care and possible admission discussed. Ice water given at this time per request.

## 2013-04-26 ENCOUNTER — Other Ambulatory Visit: Payer: Self-pay

## 2015-11-26 ENCOUNTER — Other Ambulatory Visit: Payer: Self-pay | Admitting: Orthopaedic Surgery

## 2015-11-26 ENCOUNTER — Ambulatory Visit
Admission: RE | Admit: 2015-11-26 | Discharge: 2015-11-26 | Disposition: A | Discharge status: Home / Self Care | Payer: 59 | Source: Ambulatory Visit | Attending: Orthopaedic Surgery | Admitting: Orthopaedic Surgery

## 2015-11-26 ENCOUNTER — Other Ambulatory Visit: Payer: Self-pay

## 2015-11-26 DIAGNOSIS — Z96641 Presence of right artificial hip joint: Secondary | ICD-10-CM

## 2015-11-26 DIAGNOSIS — Z96611 Presence of right artificial shoulder joint: Secondary | ICD-10-CM

## 2016-04-22 ENCOUNTER — Other Ambulatory Visit: Payer: Self-pay | Admitting: Otolaryngology

## 2016-07-06 DIAGNOSIS — M25511 Pain in right shoulder: Secondary | ICD-10-CM | POA: Diagnosis not present

## 2016-07-06 DIAGNOSIS — Z96611 Presence of right artificial shoulder joint: Secondary | ICD-10-CM | POA: Diagnosis not present

## 2016-08-17 DIAGNOSIS — L820 Inflamed seborrheic keratosis: Secondary | ICD-10-CM | POA: Diagnosis not present

## 2016-08-17 DIAGNOSIS — L814 Other melanin hyperpigmentation: Secondary | ICD-10-CM | POA: Diagnosis not present

## 2016-08-17 DIAGNOSIS — D1801 Hemangioma of skin and subcutaneous tissue: Secondary | ICD-10-CM | POA: Diagnosis not present

## 2016-10-15 DIAGNOSIS — Z0000 Encounter for general adult medical examination without abnormal findings: Secondary | ICD-10-CM | POA: Diagnosis not present

## 2016-10-15 DIAGNOSIS — M150 Primary generalized (osteo)arthritis: Secondary | ICD-10-CM | POA: Diagnosis not present

## 2016-10-15 DIAGNOSIS — Z8719 Personal history of other diseases of the digestive system: Secondary | ICD-10-CM | POA: Diagnosis not present

## 2016-12-21 IMAGING — CR DG HIP (WITH OR WITHOUT PELVIS) 2-3V*R*
3 series · 3 of 3 positions shown · non-contrast
Comparison: KUB which revealed a portion of the hip prosthesis and
coronal and sagittal CT images through the pelvis from a CT scan of
February 27, 2013

CLINICAL DATA: History of prosthetic right hip joint placement,
status string baseline x-ray due to previous studies being
encouraged.

EXAM:
DG HIP (WITH OR WITHOUT PELVIS) 2-3V RIGHT

[w pelvis upright]
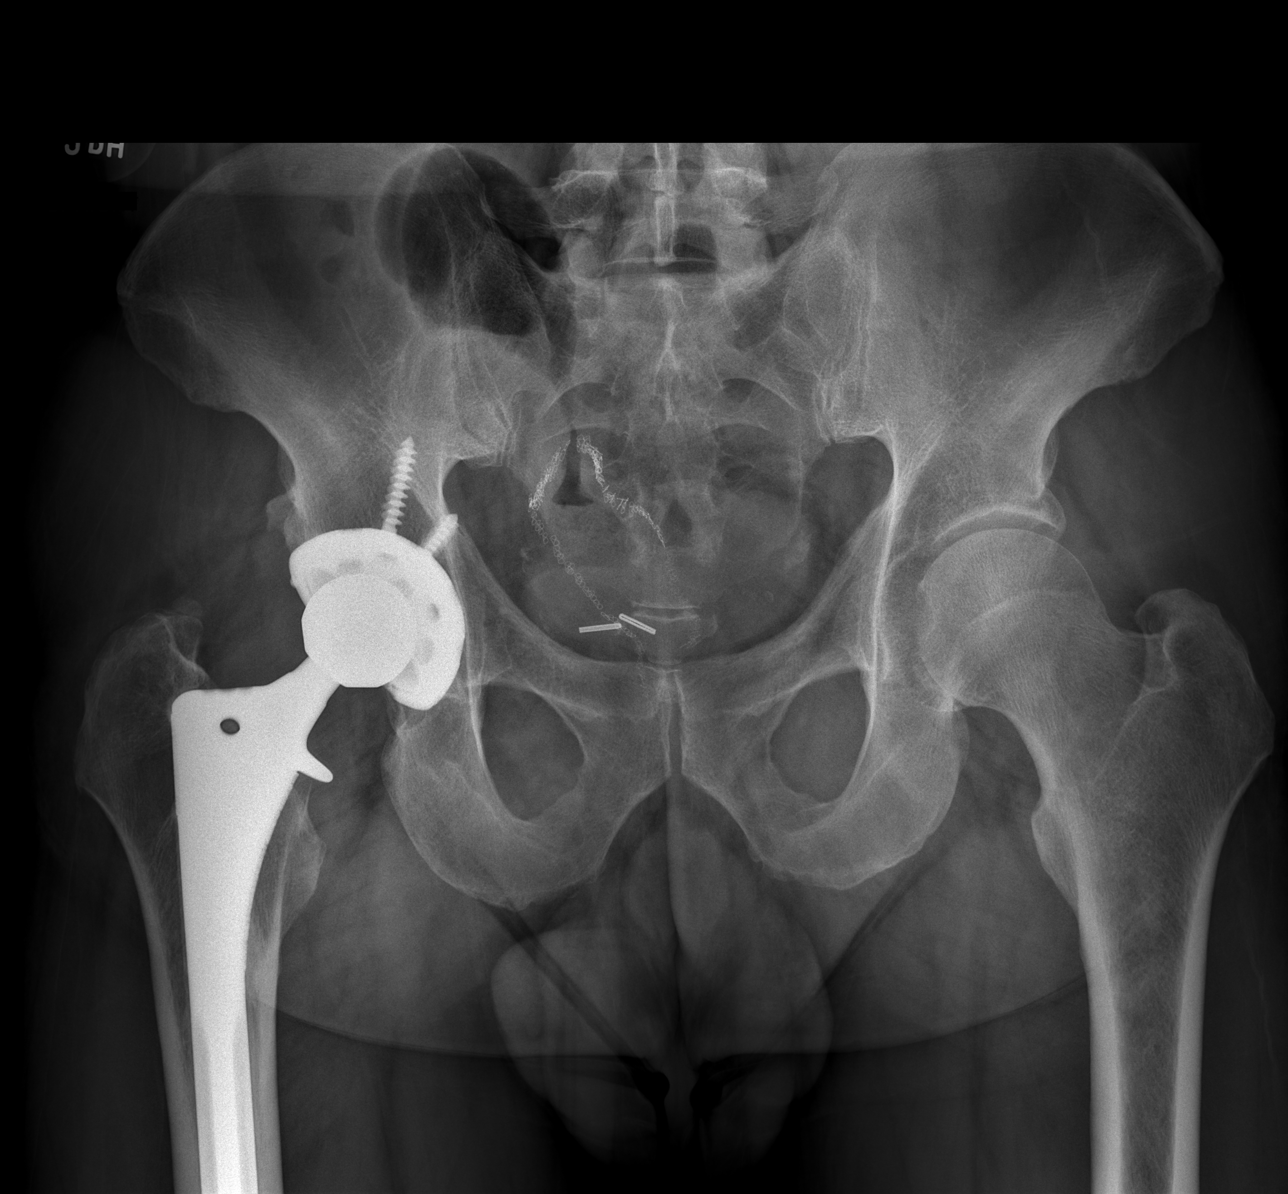

[w hip ap right]
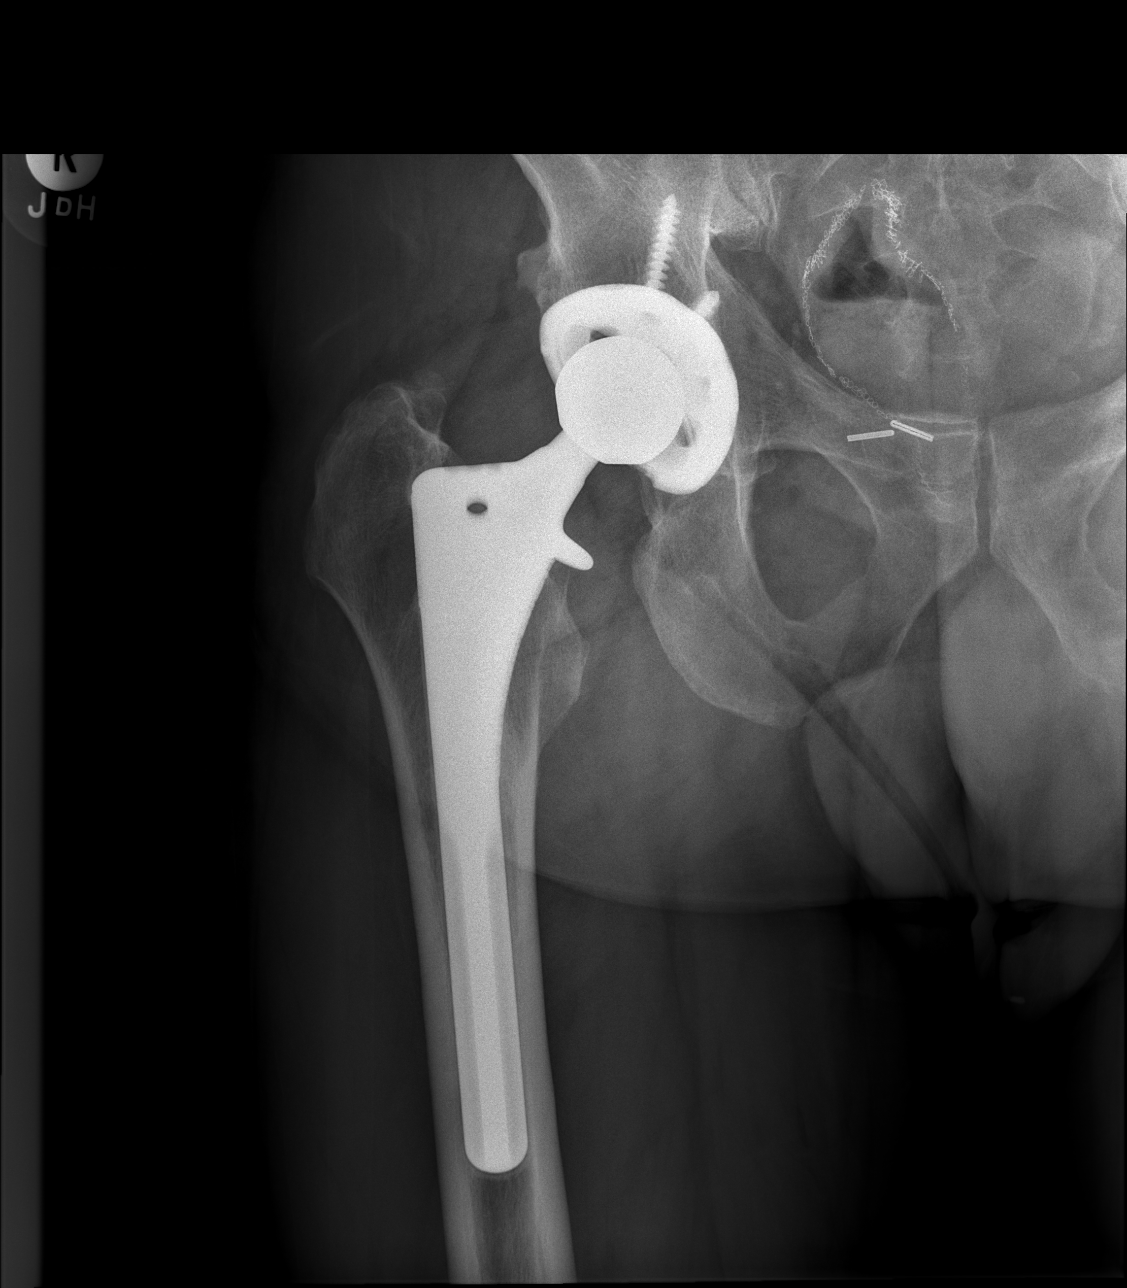

[w hip frog right]
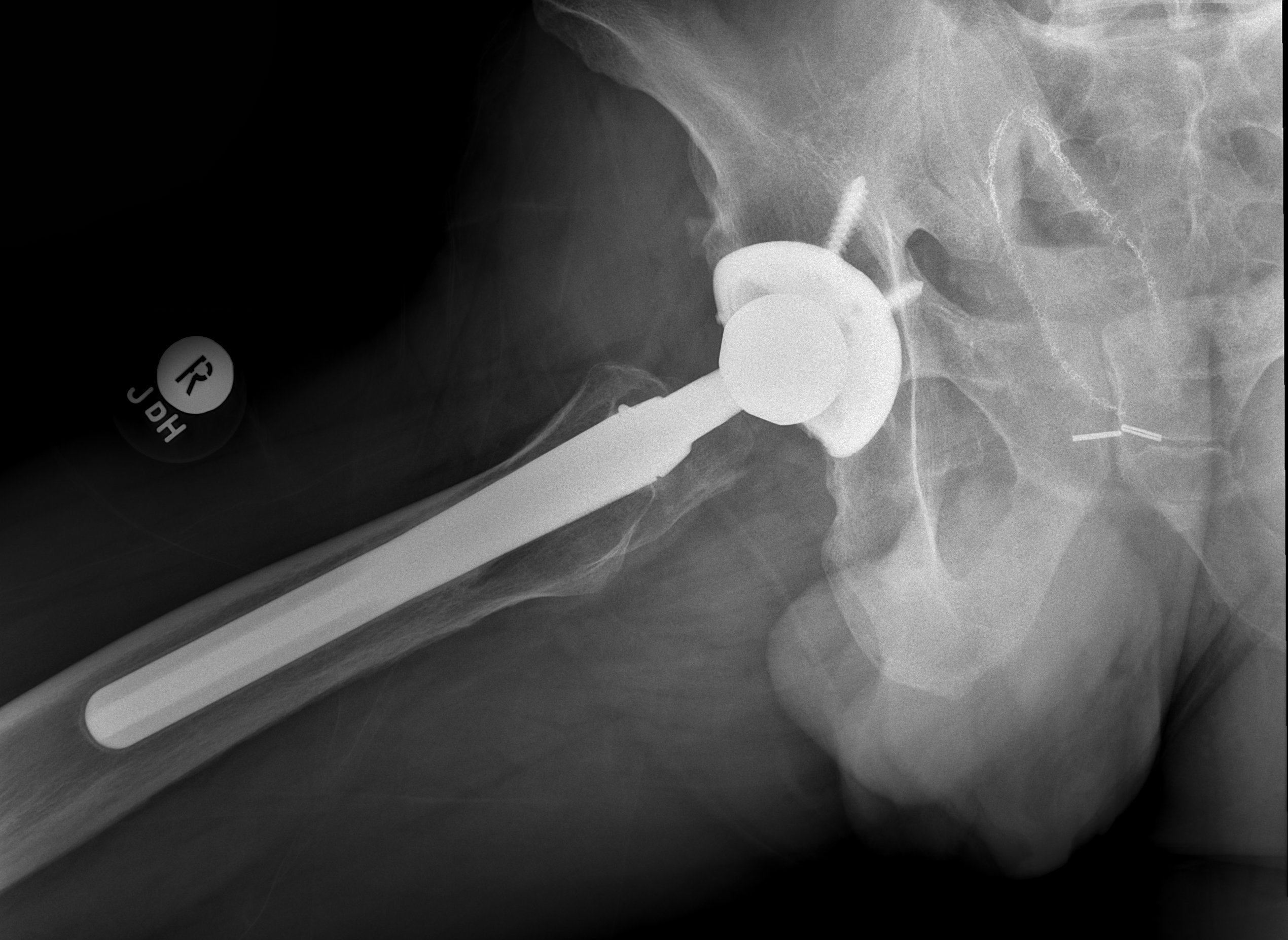

[3 of 3 positions shown; findings below may reference images not displayed]

FINDINGS: The prosthetic joint appears appropriately position. There are no
findings suspicious for loosening or infection. The surrounding
native bone is intact. Bony pelvis exhibits no acute abnormality.
There is surgical suture material and surgical clips in the pelvis.
IMPRESSION: Prosthetic right hip joint without acute or chronic abnormality.

## 2016-12-21 IMAGING — CR DG SHOULDER 2+V*R*
3 series · 3 of 3 positions shown · non-contrast
Comparison: PA chest x-ray December 27, 2007 which includes a portion
of the right shoulder.

CLINICAL DATA: History of right shoulder replacement, establish
filling a baseline due to previous studies being urged

EXAM:
RIGHT SHOULDER - 2+ VIEW

[w shoulder grashey right]
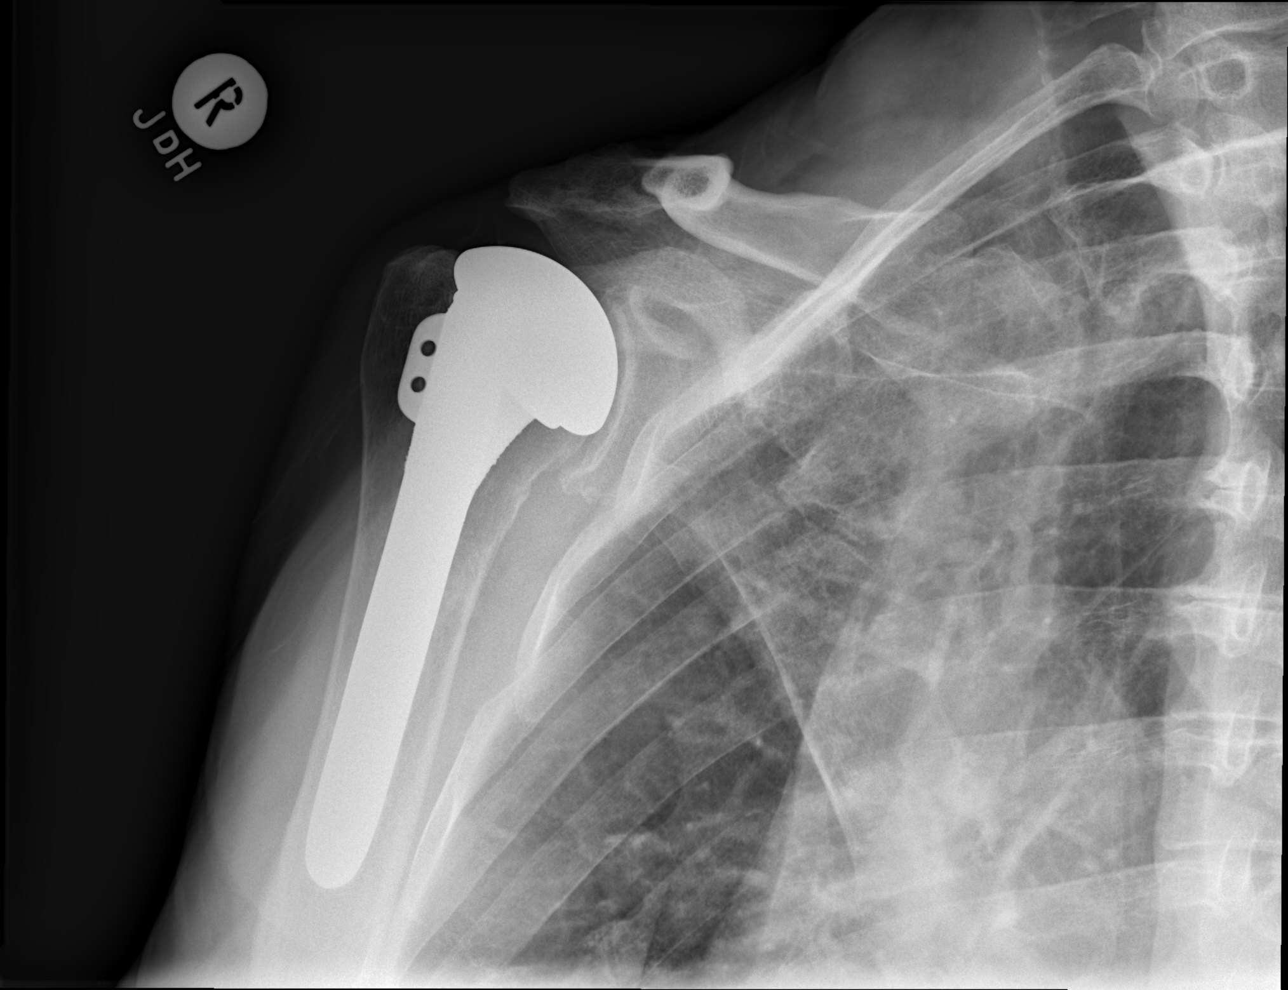

[w shoulder y-view right]
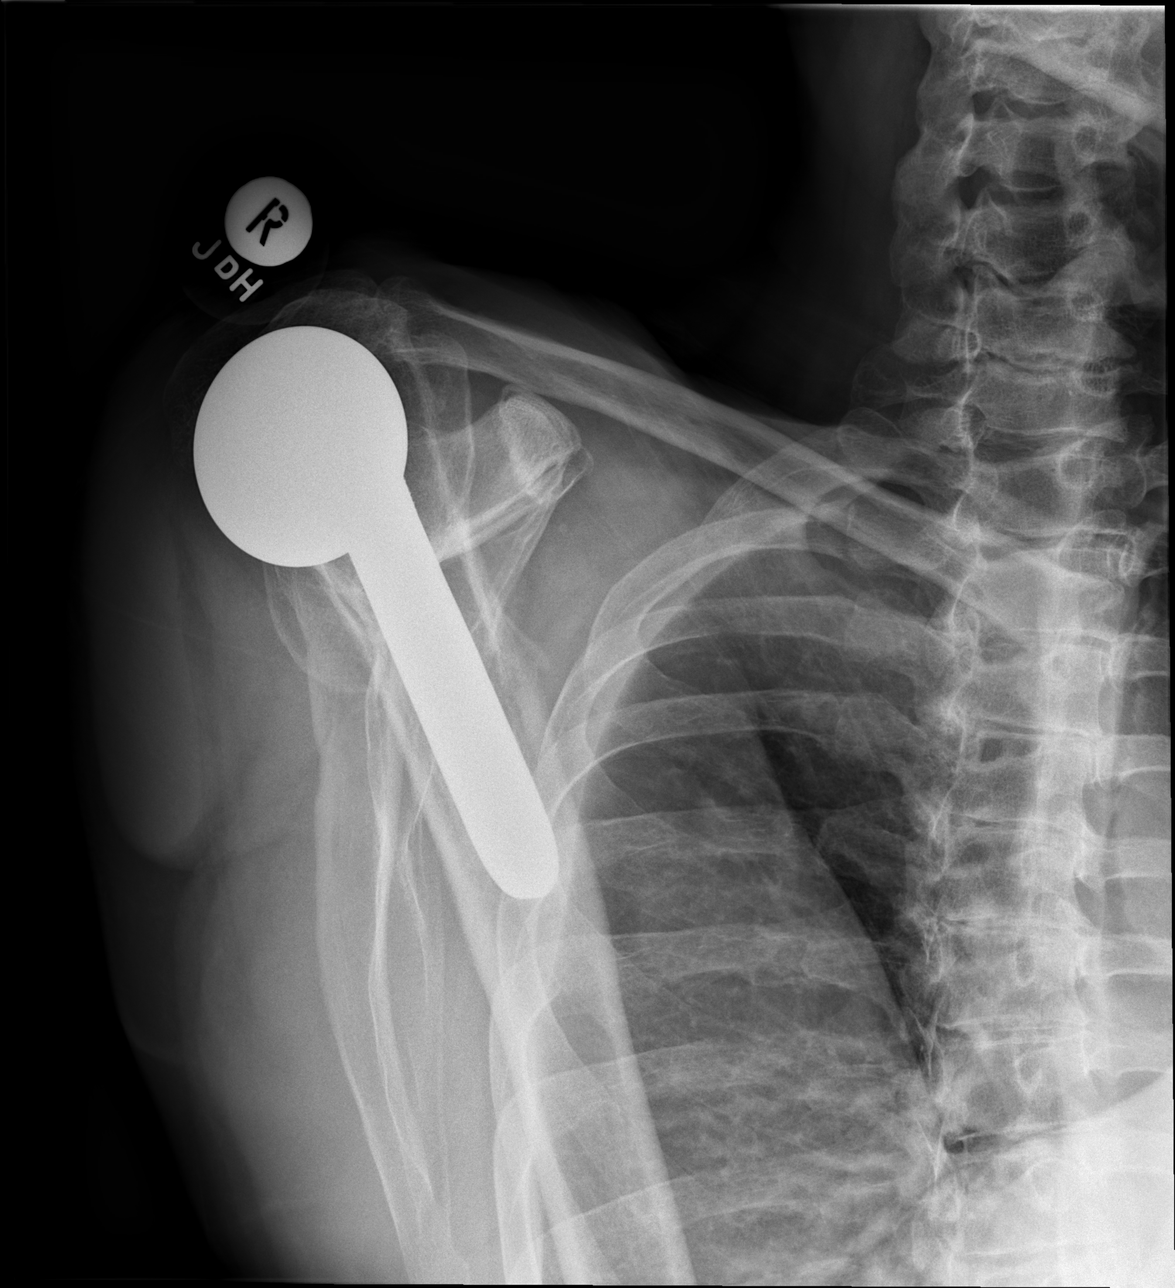

[w shoulder axillary right]
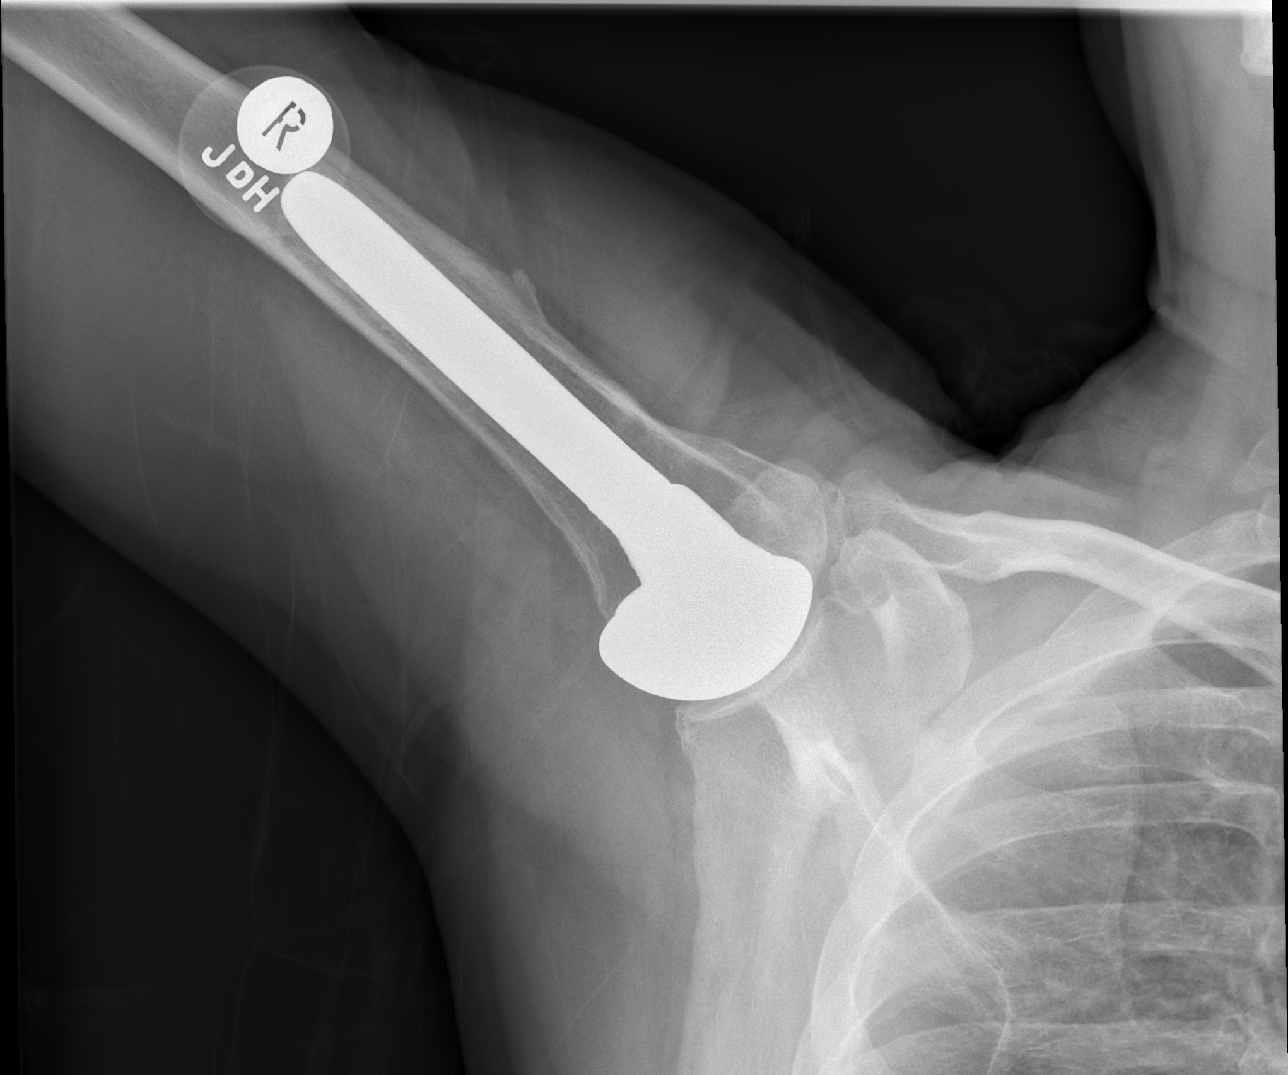

[3 of 3 positions shown; findings below may reference images not displayed]

FINDINGS: The right shoulder joint prosthesis appears to be appropriately
positioned. There is mild flattening of the acetabulum there is no
evidence of loosening or infection of the interface of the humeral
component with the native bone. The right clavicle and acromion are
grossly normal.
IMPRESSION: No acute abnormality of the prosthetic right shoulder is observed.

## 2017-03-21 DIAGNOSIS — K523 Indeterminate colitis: Secondary | ICD-10-CM | POA: Diagnosis not present

## 2017-03-21 DIAGNOSIS — K6389 Other specified diseases of intestine: Secondary | ICD-10-CM | POA: Diagnosis not present

## 2017-03-21 DIAGNOSIS — K529 Noninfective gastroenteritis and colitis, unspecified: Secondary | ICD-10-CM | POA: Diagnosis not present

## 2017-03-21 DIAGNOSIS — K56699 Other intestinal obstruction unspecified as to partial versus complete obstruction: Secondary | ICD-10-CM | POA: Diagnosis not present

## 2017-06-09 DIAGNOSIS — G8929 Other chronic pain: Secondary | ICD-10-CM | POA: Diagnosis not present

## 2017-06-09 DIAGNOSIS — M47812 Spondylosis without myelopathy or radiculopathy, cervical region: Secondary | ICD-10-CM | POA: Diagnosis not present

## 2017-06-09 DIAGNOSIS — M542 Cervicalgia: Secondary | ICD-10-CM | POA: Diagnosis not present

## 2017-07-04 DIAGNOSIS — G8929 Other chronic pain: Secondary | ICD-10-CM | POA: Diagnosis not present

## 2017-07-04 DIAGNOSIS — M542 Cervicalgia: Secondary | ICD-10-CM | POA: Diagnosis not present

## 2017-07-13 DIAGNOSIS — M542 Cervicalgia: Secondary | ICD-10-CM | POA: Diagnosis not present

## 2017-07-13 DIAGNOSIS — G8929 Other chronic pain: Secondary | ICD-10-CM | POA: Diagnosis not present

## 2017-07-18 DIAGNOSIS — G8929 Other chronic pain: Secondary | ICD-10-CM | POA: Diagnosis not present

## 2017-07-18 DIAGNOSIS — M542 Cervicalgia: Secondary | ICD-10-CM | POA: Diagnosis not present

## 2017-07-21 DIAGNOSIS — G8929 Other chronic pain: Secondary | ICD-10-CM | POA: Diagnosis not present

## 2017-07-21 DIAGNOSIS — M542 Cervicalgia: Secondary | ICD-10-CM | POA: Diagnosis not present

## 2017-07-26 DIAGNOSIS — M542 Cervicalgia: Secondary | ICD-10-CM | POA: Diagnosis not present

## 2017-07-26 DIAGNOSIS — G8929 Other chronic pain: Secondary | ICD-10-CM | POA: Diagnosis not present

## 2017-08-01 DIAGNOSIS — M542 Cervicalgia: Secondary | ICD-10-CM | POA: Diagnosis not present

## 2017-08-01 DIAGNOSIS — G8929 Other chronic pain: Secondary | ICD-10-CM | POA: Diagnosis not present

## 2017-08-04 DIAGNOSIS — G8929 Other chronic pain: Secondary | ICD-10-CM | POA: Diagnosis not present

## 2017-08-04 DIAGNOSIS — M542 Cervicalgia: Secondary | ICD-10-CM | POA: Diagnosis not present

## 2017-08-17 DIAGNOSIS — Z86018 Personal history of other benign neoplasm: Secondary | ICD-10-CM | POA: Diagnosis not present

## 2017-08-17 DIAGNOSIS — D1801 Hemangioma of skin and subcutaneous tissue: Secondary | ICD-10-CM | POA: Diagnosis not present

## 2017-08-17 DIAGNOSIS — L814 Other melanin hyperpigmentation: Secondary | ICD-10-CM | POA: Diagnosis not present

## 2017-10-05 DIAGNOSIS — K50919 Crohn's disease, unspecified, with unspecified complications: Secondary | ICD-10-CM | POA: Diagnosis not present

## 2017-10-05 DIAGNOSIS — M7661 Achilles tendinitis, right leg: Secondary | ICD-10-CM | POA: Diagnosis not present

## 2017-11-02 DIAGNOSIS — M7661 Achilles tendinitis, right leg: Secondary | ICD-10-CM | POA: Diagnosis not present

## 2017-11-02 DIAGNOSIS — K50919 Crohn's disease, unspecified, with unspecified complications: Secondary | ICD-10-CM | POA: Diagnosis not present

## 2017-11-29 DIAGNOSIS — M7661 Achilles tendinitis, right leg: Secondary | ICD-10-CM | POA: Diagnosis not present

## 2017-11-29 DIAGNOSIS — M79671 Pain in right foot: Secondary | ICD-10-CM | POA: Diagnosis not present

## 2017-11-29 DIAGNOSIS — M25371 Other instability, right ankle: Secondary | ICD-10-CM | POA: Diagnosis not present

## 2017-11-30 DIAGNOSIS — M7661 Achilles tendinitis, right leg: Secondary | ICD-10-CM | POA: Diagnosis not present

## 2017-12-01 DIAGNOSIS — M7661 Achilles tendinitis, right leg: Secondary | ICD-10-CM | POA: Diagnosis not present

## 2017-12-01 DIAGNOSIS — M79671 Pain in right foot: Secondary | ICD-10-CM | POA: Diagnosis not present

## 2017-12-01 DIAGNOSIS — M25371 Other instability, right ankle: Secondary | ICD-10-CM | POA: Diagnosis not present

## 2017-12-06 DIAGNOSIS — M7661 Achilles tendinitis, right leg: Secondary | ICD-10-CM | POA: Diagnosis not present

## 2017-12-06 DIAGNOSIS — M79671 Pain in right foot: Secondary | ICD-10-CM | POA: Diagnosis not present

## 2017-12-06 DIAGNOSIS — M25371 Other instability, right ankle: Secondary | ICD-10-CM | POA: Diagnosis not present

## 2017-12-12 DIAGNOSIS — M25371 Other instability, right ankle: Secondary | ICD-10-CM | POA: Diagnosis not present

## 2017-12-12 DIAGNOSIS — M7661 Achilles tendinitis, right leg: Secondary | ICD-10-CM | POA: Diagnosis not present

## 2017-12-12 DIAGNOSIS — M79671 Pain in right foot: Secondary | ICD-10-CM | POA: Diagnosis not present

## 2017-12-14 DIAGNOSIS — Z0000 Encounter for general adult medical examination without abnormal findings: Secondary | ICD-10-CM | POA: Diagnosis not present

## 2017-12-14 DIAGNOSIS — M25371 Other instability, right ankle: Secondary | ICD-10-CM | POA: Diagnosis not present

## 2017-12-14 DIAGNOSIS — M79671 Pain in right foot: Secondary | ICD-10-CM | POA: Diagnosis not present

## 2017-12-14 DIAGNOSIS — Z136 Encounter for screening for cardiovascular disorders: Secondary | ICD-10-CM | POA: Diagnosis not present

## 2017-12-14 DIAGNOSIS — M7661 Achilles tendinitis, right leg: Secondary | ICD-10-CM | POA: Diagnosis not present

## 2017-12-14 DIAGNOSIS — Z23 Encounter for immunization: Secondary | ICD-10-CM | POA: Diagnosis not present

## 2017-12-27 DIAGNOSIS — M79671 Pain in right foot: Secondary | ICD-10-CM | POA: Diagnosis not present

## 2017-12-27 DIAGNOSIS — M25371 Other instability, right ankle: Secondary | ICD-10-CM | POA: Diagnosis not present

## 2017-12-27 DIAGNOSIS — M7661 Achilles tendinitis, right leg: Secondary | ICD-10-CM | POA: Diagnosis not present

## 2017-12-28 DIAGNOSIS — H2513 Age-related nuclear cataract, bilateral: Secondary | ICD-10-CM | POA: Diagnosis not present

## 2017-12-28 DIAGNOSIS — H35363 Drusen (degenerative) of macula, bilateral: Secondary | ICD-10-CM | POA: Diagnosis not present

## 2017-12-29 DIAGNOSIS — M79671 Pain in right foot: Secondary | ICD-10-CM | POA: Diagnosis not present

## 2017-12-29 DIAGNOSIS — M7661 Achilles tendinitis, right leg: Secondary | ICD-10-CM | POA: Diagnosis not present

## 2017-12-29 DIAGNOSIS — M25371 Other instability, right ankle: Secondary | ICD-10-CM | POA: Diagnosis not present

## 2018-01-03 DIAGNOSIS — M79671 Pain in right foot: Secondary | ICD-10-CM | POA: Diagnosis not present

## 2018-01-03 DIAGNOSIS — M7661 Achilles tendinitis, right leg: Secondary | ICD-10-CM | POA: Diagnosis not present

## 2018-01-03 DIAGNOSIS — M25371 Other instability, right ankle: Secondary | ICD-10-CM | POA: Diagnosis not present

## 2018-01-31 DIAGNOSIS — M25371 Other instability, right ankle: Secondary | ICD-10-CM | POA: Diagnosis not present

## 2018-01-31 DIAGNOSIS — M79671 Pain in right foot: Secondary | ICD-10-CM | POA: Diagnosis not present

## 2018-01-31 DIAGNOSIS — M7661 Achilles tendinitis, right leg: Secondary | ICD-10-CM | POA: Diagnosis not present

## 2018-02-06 DIAGNOSIS — M25371 Other instability, right ankle: Secondary | ICD-10-CM | POA: Diagnosis not present

## 2018-02-06 DIAGNOSIS — M7661 Achilles tendinitis, right leg: Secondary | ICD-10-CM | POA: Diagnosis not present

## 2018-02-06 DIAGNOSIS — M79671 Pain in right foot: Secondary | ICD-10-CM | POA: Diagnosis not present

## 2018-02-13 DIAGNOSIS — M7731 Calcaneal spur, right foot: Secondary | ICD-10-CM | POA: Diagnosis not present

## 2018-02-13 DIAGNOSIS — M79671 Pain in right foot: Secondary | ICD-10-CM | POA: Diagnosis not present

## 2018-02-13 DIAGNOSIS — M7661 Achilles tendinitis, right leg: Secondary | ICD-10-CM | POA: Diagnosis not present

## 2018-02-13 DIAGNOSIS — M25371 Other instability, right ankle: Secondary | ICD-10-CM | POA: Diagnosis not present

## 2018-02-22 DIAGNOSIS — M76821 Posterior tibial tendinitis, right leg: Secondary | ICD-10-CM | POA: Diagnosis not present

## 2018-02-22 DIAGNOSIS — M7661 Achilles tendinitis, right leg: Secondary | ICD-10-CM | POA: Diagnosis not present

## 2018-02-22 DIAGNOSIS — M7731 Calcaneal spur, right foot: Secondary | ICD-10-CM | POA: Diagnosis not present

## 2018-03-03 DIAGNOSIS — M7661 Achilles tendinitis, right leg: Secondary | ICD-10-CM | POA: Diagnosis not present

## 2018-03-03 DIAGNOSIS — S86011D Strain of right Achilles tendon, subsequent encounter: Secondary | ICD-10-CM | POA: Diagnosis not present

## 2018-03-10 DIAGNOSIS — Z7289 Other problems related to lifestyle: Secondary | ICD-10-CM | POA: Diagnosis not present

## 2018-03-10 DIAGNOSIS — J320 Chronic maxillary sinusitis: Secondary | ICD-10-CM | POA: Diagnosis not present

## 2018-03-16 DIAGNOSIS — L820 Inflamed seborrheic keratosis: Secondary | ICD-10-CM | POA: Diagnosis not present

## 2018-03-21 DIAGNOSIS — H2513 Age-related nuclear cataract, bilateral: Secondary | ICD-10-CM | POA: Diagnosis not present

## 2018-03-21 DIAGNOSIS — H25013 Cortical age-related cataract, bilateral: Secondary | ICD-10-CM | POA: Diagnosis not present

## 2018-03-21 DIAGNOSIS — H2512 Age-related nuclear cataract, left eye: Secondary | ICD-10-CM | POA: Diagnosis not present

## 2018-03-21 DIAGNOSIS — H18413 Arcus senilis, bilateral: Secondary | ICD-10-CM | POA: Diagnosis not present

## 2018-03-23 DIAGNOSIS — G8918 Other acute postprocedural pain: Secondary | ICD-10-CM | POA: Diagnosis not present

## 2018-03-23 DIAGNOSIS — S86011A Strain of right Achilles tendon, initial encounter: Secondary | ICD-10-CM | POA: Diagnosis not present

## 2018-04-07 DIAGNOSIS — M7661 Achilles tendinitis, right leg: Secondary | ICD-10-CM | POA: Diagnosis not present

## 2018-04-07 DIAGNOSIS — J320 Chronic maxillary sinusitis: Secondary | ICD-10-CM | POA: Diagnosis not present

## 2018-04-11 DIAGNOSIS — M7989 Other specified soft tissue disorders: Secondary | ICD-10-CM | POA: Diagnosis not present

## 2018-04-11 DIAGNOSIS — Z4789 Encounter for other orthopedic aftercare: Secondary | ICD-10-CM | POA: Diagnosis not present

## 2018-04-16 DIAGNOSIS — Z23 Encounter for immunization: Secondary | ICD-10-CM | POA: Diagnosis not present

## 2018-05-03 DIAGNOSIS — J320 Chronic maxillary sinusitis: Secondary | ICD-10-CM | POA: Diagnosis not present

## 2018-05-09 DIAGNOSIS — M25371 Other instability, right ankle: Secondary | ICD-10-CM | POA: Diagnosis not present

## 2018-05-09 DIAGNOSIS — M79671 Pain in right foot: Secondary | ICD-10-CM | POA: Diagnosis not present

## 2018-05-09 DIAGNOSIS — Z4789 Encounter for other orthopedic aftercare: Secondary | ICD-10-CM | POA: Diagnosis not present

## 2018-05-11 DIAGNOSIS — M79671 Pain in right foot: Secondary | ICD-10-CM | POA: Diagnosis not present

## 2018-05-11 DIAGNOSIS — M25371 Other instability, right ankle: Secondary | ICD-10-CM | POA: Diagnosis not present

## 2018-05-11 DIAGNOSIS — Z4789 Encounter for other orthopedic aftercare: Secondary | ICD-10-CM | POA: Diagnosis not present

## 2018-05-23 DIAGNOSIS — M79671 Pain in right foot: Secondary | ICD-10-CM | POA: Diagnosis not present

## 2018-05-23 DIAGNOSIS — M25371 Other instability, right ankle: Secondary | ICD-10-CM | POA: Diagnosis not present

## 2018-05-23 DIAGNOSIS — Z4789 Encounter for other orthopedic aftercare: Secondary | ICD-10-CM | POA: Diagnosis not present

## 2018-06-09 DIAGNOSIS — M25371 Other instability, right ankle: Secondary | ICD-10-CM | POA: Diagnosis not present

## 2018-06-09 DIAGNOSIS — M25671 Stiffness of right ankle, not elsewhere classified: Secondary | ICD-10-CM | POA: Diagnosis not present

## 2018-06-09 DIAGNOSIS — M79671 Pain in right foot: Secondary | ICD-10-CM | POA: Diagnosis not present

## 2018-06-12 DIAGNOSIS — M25371 Other instability, right ankle: Secondary | ICD-10-CM | POA: Diagnosis not present

## 2018-06-12 DIAGNOSIS — M79671 Pain in right foot: Secondary | ICD-10-CM | POA: Diagnosis not present

## 2018-06-12 DIAGNOSIS — Z4789 Encounter for other orthopedic aftercare: Secondary | ICD-10-CM | POA: Diagnosis not present

## 2018-06-16 DIAGNOSIS — M7661 Achilles tendinitis, right leg: Secondary | ICD-10-CM | POA: Diagnosis not present

## 2018-06-27 DIAGNOSIS — H25811 Combined forms of age-related cataract, right eye: Secondary | ICD-10-CM | POA: Diagnosis not present

## 2018-06-27 DIAGNOSIS — H2511 Age-related nuclear cataract, right eye: Secondary | ICD-10-CM | POA: Diagnosis not present

## 2018-06-28 DIAGNOSIS — H2512 Age-related nuclear cataract, left eye: Secondary | ICD-10-CM | POA: Diagnosis not present

## 2018-06-29 DIAGNOSIS — S86011A Strain of right Achilles tendon, initial encounter: Secondary | ICD-10-CM | POA: Diagnosis not present

## 2018-07-04 DIAGNOSIS — M7661 Achilles tendinitis, right leg: Secondary | ICD-10-CM | POA: Diagnosis not present

## 2018-07-04 DIAGNOSIS — S93401A Sprain of unspecified ligament of right ankle, initial encounter: Secondary | ICD-10-CM | POA: Diagnosis not present

## 2018-07-04 DIAGNOSIS — M21861 Other specified acquired deformities of right lower leg: Secondary | ICD-10-CM | POA: Diagnosis not present

## 2018-07-04 DIAGNOSIS — S86011A Strain of right Achilles tendon, initial encounter: Secondary | ICD-10-CM | POA: Diagnosis not present

## 2018-07-14 DIAGNOSIS — G8918 Other acute postprocedural pain: Secondary | ICD-10-CM | POA: Diagnosis not present

## 2018-07-14 DIAGNOSIS — M7661 Achilles tendinitis, right leg: Secondary | ICD-10-CM | POA: Diagnosis not present

## 2018-07-14 DIAGNOSIS — S86011A Strain of right Achilles tendon, initial encounter: Secondary | ICD-10-CM | POA: Diagnosis not present

## 2018-07-14 DIAGNOSIS — M21861 Other specified acquired deformities of right lower leg: Secondary | ICD-10-CM | POA: Diagnosis not present

## 2018-07-18 DIAGNOSIS — H2512 Age-related nuclear cataract, left eye: Secondary | ICD-10-CM | POA: Diagnosis not present

## 2018-07-18 DIAGNOSIS — H25812 Combined forms of age-related cataract, left eye: Secondary | ICD-10-CM | POA: Diagnosis not present

## 2018-08-02 DIAGNOSIS — S86011D Strain of right Achilles tendon, subsequent encounter: Secondary | ICD-10-CM | POA: Diagnosis not present

## 2018-08-13 DIAGNOSIS — R509 Fever, unspecified: Secondary | ICD-10-CM | POA: Diagnosis not present

## 2018-08-13 DIAGNOSIS — J101 Influenza due to other identified influenza virus with other respiratory manifestations: Secondary | ICD-10-CM | POA: Diagnosis not present

## 2018-08-15 DIAGNOSIS — J101 Influenza due to other identified influenza virus with other respiratory manifestations: Secondary | ICD-10-CM | POA: Diagnosis not present

## 2018-08-18 DIAGNOSIS — J101 Influenza due to other identified influenza virus with other respiratory manifestations: Secondary | ICD-10-CM | POA: Diagnosis not present

## 2018-08-22 DIAGNOSIS — R05 Cough: Secondary | ICD-10-CM | POA: Diagnosis not present

## 2018-08-22 DIAGNOSIS — R0981 Nasal congestion: Secondary | ICD-10-CM | POA: Diagnosis not present

## 2018-08-26 DIAGNOSIS — Z9889 Other specified postprocedural states: Secondary | ICD-10-CM | POA: Diagnosis not present

## 2018-08-26 DIAGNOSIS — M25571 Pain in right ankle and joints of right foot: Secondary | ICD-10-CM | POA: Diagnosis not present

## 2018-08-26 DIAGNOSIS — S81801A Unspecified open wound, right lower leg, initial encounter: Secondary | ICD-10-CM | POA: Diagnosis not present

## 2018-08-26 DIAGNOSIS — G8918 Other acute postprocedural pain: Secondary | ICD-10-CM | POA: Diagnosis not present

## 2018-08-28 DIAGNOSIS — M7989 Other specified soft tissue disorders: Secondary | ICD-10-CM | POA: Diagnosis not present

## 2018-08-28 DIAGNOSIS — M21861 Other specified acquired deformities of right lower leg: Secondary | ICD-10-CM | POA: Diagnosis not present

## 2018-08-28 DIAGNOSIS — S86011A Strain of right Achilles tendon, initial encounter: Secondary | ICD-10-CM | POA: Diagnosis not present

## 2018-09-04 DIAGNOSIS — S91001A Unspecified open wound, right ankle, initial encounter: Secondary | ICD-10-CM | POA: Diagnosis not present

## 2018-09-05 DIAGNOSIS — S91001D Unspecified open wound, right ankle, subsequent encounter: Secondary | ICD-10-CM | POA: Diagnosis not present

## 2018-09-05 DIAGNOSIS — S91301A Unspecified open wound, right foot, initial encounter: Secondary | ICD-10-CM | POA: Diagnosis not present

## 2018-09-05 DIAGNOSIS — T8189XA Other complications of procedures, not elsewhere classified, initial encounter: Secondary | ICD-10-CM | POA: Diagnosis not present

## 2018-09-05 DIAGNOSIS — K5090 Crohn's disease, unspecified, without complications: Secondary | ICD-10-CM | POA: Diagnosis not present

## 2018-09-20 DIAGNOSIS — T8189XA Other complications of procedures, not elsewhere classified, initial encounter: Secondary | ICD-10-CM | POA: Diagnosis not present

## 2018-10-16 DIAGNOSIS — S91001A Unspecified open wound, right ankle, initial encounter: Secondary | ICD-10-CM | POA: Diagnosis not present

## 2018-10-24 DIAGNOSIS — M25571 Pain in right ankle and joints of right foot: Secondary | ICD-10-CM | POA: Diagnosis not present

## 2018-10-24 DIAGNOSIS — Z4889 Encounter for other specified surgical aftercare: Secondary | ICD-10-CM | POA: Diagnosis not present

## 2019-04-09 ENCOUNTER — Encounter: Payer: Self-pay | Admitting: Cardiology

## 2019-04-09 ENCOUNTER — Ambulatory Visit (INDEPENDENT_AMBULATORY_CARE_PROVIDER_SITE_OTHER): Payer: 59 | Admitting: Cardiology

## 2019-04-09 ENCOUNTER — Other Ambulatory Visit: Payer: Self-pay

## 2019-04-09 VITALS — BP 176/96 | HR 96 | Temp 96.9°F | Ht 72.0 in | Wt 220.0 lb

## 2019-04-09 DIAGNOSIS — I10 Essential (primary) hypertension: Secondary | ICD-10-CM | POA: Insufficient documentation

## 2019-04-09 DIAGNOSIS — Z8249 Family history of ischemic heart disease and other diseases of the circulatory system: Secondary | ICD-10-CM | POA: Diagnosis not present

## 2019-04-09 MED ORDER — CHLORTHALIDONE 25 MG PO TABS: 25.0000 mg | ORAL_TABLET | Freq: Every day | ORAL | 3 refills | Status: DC

## 2019-04-09 NOTE — Progress Notes (Signed)
Patient referred by [name], MD for screening for CAD  Subjective:   Noah Alar., male    DOB: 09/29/1960, 58 y.o.   MRN: [id number]   Chief Complaint  Patient presents with  . family history of CAD  . New Patient (Initial Visit)     HPI  58 year old male with history of ulcerative colitis, now with Crohn's disease, history of colectomy, strong family history of coronary artery disease, here for CAD screening visit.  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  However, he does walk 30 minutes every day without any complaints of chest pain, shortness of breath.  He was much more active until 18 months ago.  In the past 18 months, he has had spontaneous Achilles tendon rupture twice for which he underwent serial surgeries.  Ciprofloxacin and levofloxacin was thought to be culprit for his spontaneous rupture events.  For over a year or so, he has had "borderline" blood pressure.  More recently, he has noted high blood pressures at physician visits.  He is currently not on any antihypertensive therapy.  Patient is paternal grandfather and paternal great-grandfather died of heart attack at age 60.    Past Medical History:  Diagnosis Date  . Crohn disease   . Ulcerative colitis      Past Surgical History:  Procedure Laterality Date  . ACHILLES TENDON SURGERY    . TOTAL HIP ARTHROPLASTY Right   . TOTAL SHOULDER REPLACEMENT Right      Social History   Socioeconomic History  . Marital status: Married    Spouse name: Not on file  . Number of children: Not on file  . Years of education: Not on file  . Highest education level: Not on file  Occupational History  . Not on file  Social Needs  . Financial resource strain: Not on file  . Food insecurity    Worry: Not on file    Inability: Not on file  . Transportation needs    Medical: Not on file    Non-medical: Not on file  Tobacco Use  . Smoking status: Never Smoker  . Smokeless tobacco: Never Used   Substance and Sexual Activity  . Alcohol use: Yes  . Drug use: No  . Sexual activity: Not on file  Lifestyle  . Physical activity    Days per week: Not on file    Minutes per session: Not on file  . Stress: Not on file  Relationships  . Social Herbalist on phone: Not on file    Gets together: Not on file    Attends religious service: Not on file    Active member of club or organization: Not on file    Attends meetings of clubs or organizations: Not on file    Relationship status: Not on file  . Intimate partner violence    Fear of current or ex partner: Not on file    Emotionally abused: Not on file    Physically abused: Not on file    Forced sexual activity: Not on file  Other Topics Concern  . Not on file  Social History Narrative  . Not on file     Family History  Problem Relation Age of Onset  . Atrial fibrillation Mother   . Heart disease Mother   . Hypertension Mother   . Stroke Sister      Current Outpatient Medications on File Prior to Visit  Medication Sig Dispense  Refill  . Adalimumab (HUMIRA PEN) 40 MG/0.8ML PNKT Inject 40 mg as directed every 14 (fourteen) days.    . fluticasone (FLONASE) 50 MCG/ACT nasal spray Place 2 sprays into both nostrils as needed.    . meloxicam (MOBIC) 15 MG tablet Take 1 tablet by mouth daily.    . Multiple Vitamin (ONE DAILY) tablet Take 1 tablet by mouth daily.    . valACYclovir (VALTREX) 1000 MG tablet Take 1,000 mg by mouth 2 (two) times daily.     No current facility-administered medications on file prior to visit.     Cardiovascular studies:  EKG 04/09/2019: Sinus rhythm 97 bpm. Normal EKG.  Recent labs: 02/02/2019: Glucose 89.  BUN/creatinine 18/0.9.  GFR 78.  Sodium 137, potassium 4.3.  Rest of the CMP normal. Cholesterol 189, triglycerides 250, HDL 61, LDL 78.   Review of Systems  Constitution: Negative for decreased appetite, malaise/fatigue, weight gain and weight loss.  HENT: Negative for  congestion.   Eyes: Negative for visual disturbance.  Cardiovascular: Negative for chest pain, dyspnea on exertion, leg swelling, palpitations and syncope.  Respiratory: Negative for cough.   Endocrine: Negative for cold intolerance.  Hematologic/Lymphatic: Does not bruise/bleed easily.  Skin: Negative for itching and rash.  Musculoskeletal: Negative for myalgias.  Gastrointestinal: Negative for abdominal pain, nausea and vomiting.  Genitourinary: Negative for dysuria.  Neurological: Negative for dizziness and weakness.  Psychiatric/Behavioral: The patient is not nervous/anxious.   All other systems reviewed and are negative.        Vitals:   04/09/19 1250 04/09/19 1257  BP: (!) 165/95 (!) 176/96  Pulse: (!) 101 96  Temp: (!) 96.9 F (36.1 C)   SpO2: 96%      Body mass index is 29.84 kg/m. Filed Weights   04/09/19 1250  Weight: 220 lb (99.8 kg)     Objective:   Physical Exam  Constitutional: He is oriented to person, place, and time. He appears well-developed and well-nourished. No distress.  HENT:  Head: Normocephalic and atraumatic.  Eyes: Pupils are equal, round, and reactive to light. Conjunctivae are normal.  Neck: No JVD present.  Cardiovascular: Normal rate, regular rhythm and intact distal pulses.  No murmur heard. Pulmonary/Chest: Effort normal and breath sounds normal. He has no wheezes. He has no rales.  Abdominal: Soft. Bowel sounds are normal. There is no rebound.  Musculoskeletal:        General: Edema (1+ Left) present.     Comments: Surgical scar right Achilles tendon area  Lymphadenopathy:    He has no cervical adenopathy.  Neurological: He is alert and oriented to person, place, and time. No cranial nerve deficit.  Skin: Skin is warm and dry.  Psychiatric: He has a normal mood and affect.  Nursing note and vitals reviewed.         Assessment & Recommendations:   58 year old male with history of ulcerative colitis, now with Crohn's  disease, history of colectomy, strong family history of coronary artery disease, here for CAD screening visit.  Hypertension: Previously diagnosed to have prehypertension.  I believe patient now has progressed to hypertension.  Started chlorthalidone 25 mg daily.  Primary prevention: Strong family history of early coronary artery disease.  Recommend calcium score scan for restratification.  His lipid panel shows favorable HDL, LDL, and total cholesterol.  However, triglycerides elevated.  His previous lipid panel was nonfasting.  Recommend repeat fasting lipid panel checked.  Recommend heart healthy [place]-Mediterranean diet and regular physical activity.   Thank you  for referring the patient to [place]. Please feel free to contact with any questions.  Nigel Mormon, MD [facility] [place] Pager: [phone] Office: [phone] If no answer Cell [phone]

## 2019-04-20 NOTE — Progress Notes (Signed)
S/w pt advised him of calcium score and advised him to have fasting labs prior to visit.

## 2019-04-20 NOTE — Progress Notes (Signed)
LVM for pt to call back.

## 2019-05-01 LAB — LIPID PANEL
Chol/HDL Ratio: 3 ratio (ref 0.0–5.0)
Cholesterol, Total: 193 mg/dL (ref 100–199)
HDL: 65 mg/dL (ref >39)
LDL Chol Calc (NIH): 100 mg/dL — ABNORMAL HIGH (ref 0–99)
Triglycerides: 166 mg/dL — ABNORMAL HIGH (ref 0–149)
VLDL Cholesterol Cal: 28 mg/dL (ref 5–40)

## 2019-05-07 ENCOUNTER — Other Ambulatory Visit: Payer: Self-pay

## 2019-05-07 ENCOUNTER — Ambulatory Visit (INDEPENDENT_AMBULATORY_CARE_PROVIDER_SITE_OTHER): Payer: 59 | Admitting: Cardiology

## 2019-05-07 ENCOUNTER — Encounter: Payer: Self-pay | Admitting: Cardiology

## 2019-05-07 VITALS — BP 131/91 | HR 91 | Temp 98.3°F | Ht 72.0 in | Wt 216.0 lb

## 2019-05-07 DIAGNOSIS — I10 Essential (primary) hypertension: Secondary | ICD-10-CM | POA: Diagnosis not present

## 2019-05-07 DIAGNOSIS — R931 Abnormal findings on diagnostic imaging of heart and coronary circulation: Secondary | ICD-10-CM

## 2019-05-07 MED ORDER — ROSUVASTATIN CALCIUM 10 MG PO TABS: 10.0000 mg | ORAL_TABLET | Freq: Every day | ORAL | 3 refills | Status: DC

## 2019-05-07 NOTE — Progress Notes (Signed)
Patient referred by [name], MD for screening for CAD  Subjective:   [name]., male    DOB: February 05, 1961, 58 y.o.   MRN: [id number]   Chief Complaint  Patient presents with  . Coronary Artery Disease  . Hyperlipidemia  . Follow-up    4 week   HPI  58 year old male with hypertension, calcium score 2, strong family history of coronary artery disease, calcium score 2, history of ulcerative colitis/Crohn's disease, history of colectomy.  His calcium score is minimally elevated with score of 2 in LAD.  He exercises regularly without any symptoms of chest pain or shortness of breath.  Blood pressure is improving.    Past Medical History:  Diagnosis Date  . Crohn disease ([place])   . Ulcerative colitis [facility])      Past Surgical History:  Procedure Laterality Date  . ACHILLES TENDON SURGERY    . TOTAL HIP ARTHROPLASTY Right   . TOTAL SHOULDER REPLACEMENT Right      Social History   Socioeconomic History  . Marital status: Married    Spouse name: Not on file  . Number of children: 2  . Years of education: Not on file  . Highest education level: Not on file  Occupational History  . Not on file  Social Needs  . Financial resource strain: Not on file  . Food insecurity    Worry: Not on file    Inability: Not on file  . Transportation needs    Medical: Not on file    Non-medical: Not on file  Tobacco Use  . Smoking status: Never Smoker  . Smokeless tobacco: Never Used  Substance and Sexual Activity  . Alcohol use: Not Currently    Comment: occ  . Drug use: No  . Sexual activity: Not on file  Lifestyle  . Physical activity    Days per week: Not on file    Minutes per session: Not on file  . Stress: Not on file  Relationships  . Social Herbalist on phone: Not on file    Gets together: Not on file    Attends religious service: Not on file    Active member of club or organization: Not on file    Attends meetings of clubs or  organizations: Not on file    Relationship status: Not on file  . Intimate partner violence    Fear of current or ex partner: Not on file    Emotionally abused: Not on file    Physically abused: Not on file    Forced sexual activity: Not on file  Other Topics Concern  . Not on file  Social History Narrative  . Not on file     Family History  Problem Relation Age of Onset  . Atrial fibrillation Mother   . Heart disease Mother   . Hypertension Mother   . Stroke Sister   . CAD Paternal Grandfather      Current Outpatient Medications on File Prior to Visit  Medication Sig Dispense Refill  . Adalimumab (HUMIRA PEN) 40 MG/0.8ML PNKT Inject 40 mg as directed every 14 (fourteen) days.    . chlorthalidone (HYGROTON) 25 MG tablet Take 1 tablet by mouth daily.    . fluticasone (FLONASE) 50 MCG/ACT nasal spray Place 2 sprays into both nostrils as needed.    . meloxicam (MOBIC) 15 MG tablet Take 1 tablet by mouth daily.    . Multiple Vitamin (ONE DAILY) tablet Take  1 tablet by mouth daily.    . valACYclovir (VALTREX) 1000 MG tablet Take 1,000 mg by mouth 2 (two) times daily.     No current facility-administered medications on file prior to visit.     Cardiovascular studies:  Calcium scoring 04/18/2019: LM: 0 LAD: 2 LCx: 0 RCA: 0  EKG 04/09/2019: Sinus rhythm 97 bpm. Normal EKG.  Recent labs: 02/02/2019: Glucose 89.  BUN/creatinine 18/0.9.  GFR 78.  Sodium 137, potassium 4.3.  Rest of the CMP normal. Cholesterol 189, triglycerides 250, HDL 61, LDL 78.   Review of Systems  Constitution: Negative for decreased appetite, malaise/fatigue, weight gain and weight loss.  HENT: Negative for congestion.   Eyes: Negative for visual disturbance.  Cardiovascular: Negative for chest pain, dyspnea on exertion, leg swelling, palpitations and syncope.  Respiratory: Negative for cough.   Endocrine: Negative for cold intolerance.  Hematologic/Lymphatic: Does not bruise/bleed easily.   Skin: Negative for itching and rash.  Musculoskeletal: Negative for myalgias.  Gastrointestinal: Negative for abdominal pain, nausea and vomiting.  Genitourinary: Negative for dysuria.  Neurological: Negative for dizziness and weakness.  Psychiatric/Behavioral: The patient is not nervous/anxious.   All other systems reviewed and are negative.        Vitals:   05/07/19 1132  BP: (!) 167/98  Pulse: 96  Temp: 98.3 F (36.8 C)  SpO2: 97%     Body mass index is 29.29 kg/m. Filed Weights   05/07/19 1132  Weight: 216 lb (98 kg)     Objective:   Physical Exam  Constitutional: He is oriented to person, place, and time. He appears well-developed and well-nourished. No distress.  HENT:  Head: Normocephalic and atraumatic.  Eyes: Pupils are equal, round, and reactive to light. Conjunctivae are normal.  Neck: No JVD present.  Cardiovascular: Normal rate, regular rhythm and intact distal pulses.  No murmur heard. Pulmonary/Chest: Effort normal and breath sounds normal. He has no wheezes. He has no rales.  Abdominal: Soft. Bowel sounds are normal. There is no rebound.  Musculoskeletal:        General: Edema (1+ Left) present.     Comments: Surgical scar right Achilles tendon area  Lymphadenopathy:    He has no cervical adenopathy.  Neurological: He is alert and oriented to person, place, and time. No cranial nerve deficit.  Skin: Skin is warm and dry.  Psychiatric: He has a normal mood and affect.  Nursing note and vitals reviewed.          Assessment & Recommendations:   58 year old male with hypertension, calcium score 2, strong family history of coronary artery disease, calcium score 2, history of ulcerative colitis/Crohn's disease, history of colectomy.  Hypertension: Improved, but remains suboptimal on chlorthalidone 25 mg daily.  There is increase physical activity and regular home monitoring.  No changes made today.  Primary prevention: Strong family history  of early coronary artery disease, with minimally elevated calcium score in LAD (2). This gives [place] good opportunity to proceeding aggressive prevention therapies.  While his lipid panel is favorable, I recommend low-dose statin to reduce lifetime risk of obstructive CAD/MI. Started rosuvastatin 10 mg daily.  Reasonable to omit Aspirin at this time.   Diet & Lifestyle recommendations:  Physical activity recommendation (The Physical Activity Guidelines for Americans. JAMA 2018;Nov 12) At least 150-300 minutes a week of moderate-intensity, or 75-150 minutes a week of vigorous-intensity aerobic physical activity, or an equivalent combination of moderate- and vigorous-intensity aerobic activity. Adults should perform muscle-strengthening activities on 2 or  more days a week. Older adults should do multicomponent physical activity that includes balance training as well as aerobic and muscle-strengthening activities. Benefits of increased physical activity include lower risk of mortality including cardiovascular mortality, lower risk of cardiovascular events and associated risk factors (hypertension and diabetes), and lower risk of many cancers (including bladder, breast, colon, endometrium, esophagus, kidney, lung, and stomach). Additional improvments have been seen in cognition, risk of dementia, anxiety and depression, improved bone health, lower risk of falls, and associated injuries.  Dietary recommendation The 2019 ACC/AHA guidelines promote nutrition as a main fixture of cardiovascular wellness, with a recommendation for a varied diet of fruit, vegetables, fish, legumes, and whole grains (Class I), as well as recommendations to reduce sodium, cholesterol, processed meats, and refined sugars (Class IIa recommendation).10 Sodium intake, a topic of some controversy as of late, is recommended to be kept at 1,500 mg/day or less, far below the average daily intake in the [place] of 3,409 mg/day, and notably below that  of previous US recommendations for 300mg /day.10,11 For those unable to reach 1,500 mg/day, they recommend at least a reduction of 1000 mg/day.  A [place]-Mediterranean Diet With Intermittent Fasting: JACC Review Topic of the Week. J Am Coll Cardiol [id number] Pes[place]-Mediterranean diet, it is supplemented with extra-virgin olive oil (EVOO), which is the principle fat source, along with moderate amounts of dairy (particularly yogurt and cheese) and eggs, as well as modest amounts of alcohol consumption (ideally red wine with the evening meal), but few red and processed meats.   Thank you for referring the patient to [place]. Please feel free to contact with any questions.  Nigel Mormon, MD [facility] Pager: [phone] Office: [phone] If no answer Cell [phone]

## 2019-07-31 LAB — LIPID PANEL
Chol/HDL Ratio: 2.4 ratio (ref 0.0–5.0)
Cholesterol, Total: 157 mg/dL (ref 100–199)
HDL: 65 mg/dL (ref >39)
LDL Chol Calc (NIH): 64 mg/dL (ref 0–99)
Triglycerides: 170 mg/dL — ABNORMAL HIGH (ref 0–149)
VLDL Cholesterol Cal: 28 mg/dL (ref 5–40)

## 2019-08-10 ENCOUNTER — Telehealth: Payer: Self-pay

## 2019-08-10 ENCOUNTER — Ambulatory Visit: Payer: 59 | Admitting: Cardiology

## 2019-08-24 ENCOUNTER — Other Ambulatory Visit: Payer: Self-pay | Admitting: Cardiology

## 2019-08-27 ENCOUNTER — Ambulatory Visit: Payer: 59 | Attending: Internal Medicine

## 2019-08-27 DIAGNOSIS — Z23 Encounter for immunization: Secondary | ICD-10-CM | POA: Insufficient documentation

## 2019-08-27 NOTE — Progress Notes (Signed)
   [id number]  Name:  [name].    MRN: [id number] DOB: September 25, 1960  08/27/2019  [name] was observed post Covid-19 immunization for 15 minutes without incident. He was provided with Vaccine Information Sheet and instruction to access the V-Safe system.   [name] was instructed to call 911 with any severe reactions post vaccine: Marland Kitchen Difficulty breathing  . Swelling of face and throat  . A fast heartbeat  . A bad rash all over body  . Dizziness and weakness   Immunizations Administered    Name Date Dose VIS Date Route   Pfizer COVID-19 Vaccine 08/27/2019  6:25 PM 0.3 mL 06/01/2019 Intramuscular   Manufacturer: [place]   Lot: [id number]   [place]: [id number]

## 2019-09-08 ENCOUNTER — Other Ambulatory Visit: Payer: Self-pay | Admitting: Cardiology

## 2019-09-08 DIAGNOSIS — R931 Abnormal findings on diagnostic imaging of heart and coronary circulation: Secondary | ICD-10-CM

## 2019-09-17 ENCOUNTER — Ambulatory Visit: Payer: 59 | Attending: Internal Medicine

## 2019-09-17 DIAGNOSIS — Z23 Encounter for immunization: Secondary | ICD-10-CM

## 2019-09-17 NOTE — Progress Notes (Signed)
   [id number]  Name:  [name].    MRN: [id number] DOB: 1961/01/20  09/17/2019  [name] was observed post Covid-19 immunization for 15 minutes without incident. He was provided with Vaccine Information Sheet and instruction to access the V-Safe system.   [name] was instructed to call 911 with any severe reactions post vaccine: Marland Kitchen Difficulty breathing  . Swelling of face and throat  . A fast heartbeat  . A bad rash all over body  . Dizziness and weakness   Immunizations Administered    Name Date Dose VIS Date Route   Pfizer COVID-19 Vaccine 09/17/2019  9:44 AM 0.3 mL 06/01/2019 Intramuscular   Manufacturer: [place]   Lot: [id number]   [place]: [id number]

## 2020-01-02 ENCOUNTER — Other Ambulatory Visit: Payer: Self-pay | Admitting: Cardiology

## 2020-01-02 DIAGNOSIS — R931 Abnormal findings on diagnostic imaging of heart and coronary circulation: Secondary | ICD-10-CM

## 2020-02-22 ENCOUNTER — Other Ambulatory Visit: Payer: Self-pay | Admitting: Cardiology

## 2020-07-17 ENCOUNTER — Other Ambulatory Visit: Payer: Self-pay | Admitting: Cardiology

## 2020-08-19 HISTORY — PX: ABDOMINAL SURGERY: SHX537

## 2021-01-02 ENCOUNTER — Encounter: Payer: Self-pay | Admitting: Cardiology

## 2021-01-02 ENCOUNTER — Ambulatory Visit: Payer: 59 | Admitting: Cardiology

## 2021-01-02 ENCOUNTER — Other Ambulatory Visit: Payer: Self-pay

## 2021-01-02 VITALS — BP 129/82 | HR 80 | Temp 98.4°F | Resp 17 | Ht 73.0 in | Wt 208.0 lb

## 2021-01-02 DIAGNOSIS — Z8249 Family history of ischemic heart disease and other diseases of the circulatory system: Secondary | ICD-10-CM

## 2021-01-02 DIAGNOSIS — R931 Abnormal findings on diagnostic imaging of heart and coronary circulation: Secondary | ICD-10-CM

## 2021-01-02 DIAGNOSIS — I10 Essential (primary) hypertension: Secondary | ICD-10-CM

## 2021-01-02 MED ORDER — ROSUVASTATIN CALCIUM 10 MG PO TABS: 10.0000 mg | ORAL_TABLET | Freq: Every day | ORAL | 3 refills | Status: AC

## 2021-01-02 NOTE — Progress Notes (Signed)
Patient referred by [name], MD for screening for CAD  Subjective:   Noah Alar., male    DOB: 07/24/1960, 60 y.o.   MRN: [id number]   Chief Complaint  Patient presents with   Hypertension   Elevated coronary artery calcium score   Follow-up    1 year   HPI  60 year old male with hypertension, Chron's disease s/o colectomy,  strong family history of coronary artery disease, minimally elevated calcium score (2),  Denies fevers, patient has undergone revision surgeries for his Crohn's disease, complicated by blood loss anemia.  This is now improved.  He is now off chlorthalidone, blood pressure stable control.  He is walking regularly without any complaints of chest pain or shortness of breath.   Current Outpatient Medications on File Prior to Visit  Medication Sig Dispense Refill   chlorthalidone (HYGROTON) 25 MG tablet Take 25 mg by mouth daily.     cycloSPORINE (RESTASIS) 0.05 % ophthalmic emulsion Place 1 drop into both eyes in the morning and at bedtime.     ferrous sulfate 325 (65 FE) MG tablet Take by mouth.     fluticasone (FLONASE) 50 MCG/ACT nasal spray Place 2 sprays into both nostrils as needed.     Multiple Vitamin (ONE DAILY) tablet Take 1 tablet by mouth daily.     rosuvastatin (CRESTOR) 10 MG tablet TAKE 1 TABLET(10 MG) BY MOUTH DAILY 30 tablet 6   valACYclovir (VALTREX) 1000 MG tablet Take 1,000 mg by mouth 2 (two) times daily.     No current facility-administered medications on file prior to visit.    Cardiovascular studies:  EKG 01/02/2021: Sinus rhythm 85 bpm  Normal EKG  Calcium scoring 04/18/2019: LM: 0 LAD: 2 LCx: 0 RCA: 0  Recent labs: 11/18/2020: H/H 14/43. MCV 82. Platelets 283  09/11/2020: Glucose 102, BUN/Cr 19/.085. EGFR >90. Na/K 133/4.5. Rest of the CMP normal H/H 9.6/27. MCV 86. Platelets 377   07/30/2019: Chol 157, TG 170, HDL 65, LDL 64  02/02/2019: Glucose 89.  BUN/creatinine 18/0.9.  GFR 78.  Sodium 137,  potassium 4.3.  Rest of the CMP normal. Cholesterol 189, triglycerides 250, HDL 61, LDL 78.   Review of Systems  Cardiovascular:  Negative for chest pain, dyspnea on exertion, leg swelling, palpitations and syncope.        Vitals:   01/02/21 0948  BP: 129/82  Pulse: 80  Resp: 17  Temp: 98.4 F (36.9 C)  SpO2: 97%     Body mass index is 27.44 kg/m. Filed Weights   01/02/21 0948  Weight: 208 lb (94.3 kg)     Objective:   Physical Exam Vitals and nursing note reviewed.  Constitutional:      General: He is not in acute distress. Neck:     Vascular: No JVD.  Cardiovascular:     Rate and Rhythm: Normal rate and regular rhythm.     Heart sounds: Normal heart sounds. No murmur heard. Pulmonary:     Effort: Pulmonary effort is normal.     Breath sounds: Normal breath sounds. No wheezing or rales.  Abdominal:     General: A surgical scar is present.  Musculoskeletal:     Right lower leg: No edema.     Left lower leg: No edema.           Assessment & Recommendations:   60 year old male with hypertension, Chron's disease s/o colectomy,  strong family history of coronary artery disease, minimally elevated calcium score (2),  Hypertension: Uncontrolled without medication.  Primary prevention: Strong family history of early coronary artery disease, with minimally elevated calcium score in LAD (2). Check lipid panel.  Refilled rosuvastatin 10 mg daily.   Follow up as needed  Nigel Mormon, MD [facility] Pager: [phone] Office: [phone] If no answer Cell [phone]

## 2022-01-07 ENCOUNTER — Other Ambulatory Visit: Payer: Self-pay | Admitting: Cardiology

## 2022-01-07 DIAGNOSIS — R931 Abnormal findings on diagnostic imaging of heart and coronary circulation: Secondary | ICD-10-CM

## 2023-03-01 ENCOUNTER — Other Ambulatory Visit: Payer: Self-pay | Admitting: Family Medicine

## 2023-03-01 ENCOUNTER — Ambulatory Visit
Admission: RE | Admit: 2023-03-01 | Discharge: 2023-03-01 | Disposition: A | Discharge status: Home / Self Care | Payer: 59 | Source: Ambulatory Visit | Attending: Family Medicine | Admitting: Family Medicine

## 2023-03-01 DIAGNOSIS — M79671 Pain in right foot: Secondary | ICD-10-CM

## 2024-03-13 NOTE — Discharge Summary (Signed)
 Orthopedic Surgery Discharge Summary  Patient ID: [name] [id number] 63 y.o. 08-21-1960  Admit date: 03/12/2024 Admitting Physician: Morene Gavel, MD Admission Diagnoses:  Principal Problem:   History of revision of total shoulder arthroplasty Active Problems:   Crohn's disease with complication (HCC)   H/O total colectomy   S/P shoulder replacement, right   Rotator cuff tear arthropathy, right Resolved Problems:   * No resolved hospital problems. *     Discharge date and time: 03/14/2024   Discharge Physician: Morene Gavel, MD Discharge Diagnoses:  Principal Problem:   History of revision of total shoulder arthroplasty Active Problems:   Crohn's disease with complication (HCC)   H/O total colectomy   S/P shoulder replacement, right   Rotator cuff tear arthropathy, right Resolved Problems:   * No resolved hospital problems. *     Surgical operations performed during hospitalization: Procedure(s) (LRB): REVISION SHOULDER ARTHROPLASTY - CONVERSION HEMIARTHROPLASTY TO REVERSE TOTAL SHOULDER ARTHROPLASTY (TORNIER, SCHUKLA EXTRACTION SYSTEM), RIGHT SHOULDER (Right)   Indications for Operative Procedures: History of revision of total shoulder arthroplasty   Hospital Course:   The patient was taken to the operating room on 03/12/2024 and underwent the above stated surgical procedure without complications.  For details of the operative procedure, please refer to the operative note.  The patient was then transferred to the post-op Orthopaedic floor for recovery, physical therapy and discharge planning. The patient was given prophylactic dose ASA for the prevention of DVTs, along with early ambulation and sequential compression devices.   The patient had adequate pain control with po pain medications and IV pain medications for breakthrough pain. At the time of discharge, the patient's pain was well controlled with po pain medications. Occupational therapy was  initiated and worked with the patient towards discharge goals with clearance from OT for discharge home by the time of discharge.   The patient will be NWB with sling to the right arm.The patient has a surgical dressing in place to the surgical incision. Surgical site wound care instructions provided at discharge. On-Q pump present and set at 8 mL/hr. Drain removed Wed 03/14/2024. At the time of discharge, the patient is afebrile, vital signs are stable and the patient is in no acute distress. Compartments are soft. Peripheral pulses are 2+ bilaterally. The patient is neurovascularly intact with an exam as per the last progress note. The patient is medically stable and safe for discharge home to continue their rehabilitation.   Consults:  None   Disposition: Home  Patient Instructions:     Medication List     PAUSE taking these medications    diclofenac 75 mg EC tablet Wait to take this until your doctor or other care provider tells you to start again. Commonly known as: VOLTAREN Take 1 tablet (75 mg total) by mouth 2 (two) times a day.       START taking these medications    aspirin 81 mg EC tablet Take 1 tablet (81 mg total) by mouth 2 (two) times a day   methocarbamoL 500 mg tablet Commonly known as: ROBAXIN Take 1 tablet (500 mg total) by mouth 4 (four) times a day as needed for muscle spasms.   ondansetron  4 mg disintegrating tablet Commonly known as: ZOFRAN -ODT Dissolve 1 tablet (4 mg total) on tongue every 6 (six) hours as needed for nausea or vomiting.   oxyCODONE  5 mg immediate release tablet Commonly known as: ROXICODONE  Take 1-2 tablets by mouth every 4 (four) hours as needed for moderate  pain or severe pain. (Take 1-2 tablets (5-10 mg total) by mouth every 4 (four) hours as needed for moderate pain (4-6) or severe pain (7-10).)   Senna Plus 8.6-50 mg per tablet Generic drug: sennosides-docusate sodium Take 2 tablets by mouth daily.       CHANGE how you  take these medications    acetaminophen  500 mg tablet Commonly known as: TYLENOL  Take 1 tablet (500 mg total) by mouth every 6 (six) hours as needed for mild pain (1-3). What changed:  how much to take when to take this reasons to take this Another medication with the same name was removed. Continue taking this medication, and follow the directions you see here.       CONTINUE taking these medications    albuterol HFA 90 mcg/actuation inhaler Commonly known as: PROVENTIL HFA;VENTOLIN HFA;PROAIR HFA Inhale 1 puff every 6 (six) hours as needed for wheezing or shortness of breath.   calcium  carbonate 1250 mg (500 mg calcium ) tablet Commonly known as: OS-CAL Take 1 tablet by mouth daily.   cholecalciferol 5,000 unit (125 mcg) Tab tablet Commonly known as: VITAMIN D3 Take 5,000 Units by mouth daily.   ciprofloxacin 500 mg tablet Commonly known as: CIPRO Take 500 mg by mouth 2 (two) times a day.   fluticasone propionate 50 mcg/spray nasal spray Commonly known as: FLONASE SHAKE LIQUID AND USE 2 SPRAYS IN EACH NOSTRIL EVERY NIGHT   guselkumab 200 mg/2 mL pen Commonly known as: TREMFYA Inject 400 mg under the skin every 28 days.   loperamide 2 mg capsule Commonly known as: IMODIUM Take 2 mg by mouth 4 (four) times a day as needed.   metroNIDAZOLE 500 mg tablet Commonly known as: FLAGYL Take 500 mg by mouth 2 (two) times a day.   One-A-Day Men's Multivitamin 400-20-300 mcg Tab Generic drug: multivit-min-folic acid-vit K-lycop Take 1 tablet by mouth daily.   Restasis 0.05 % ophthalmic emulsion Generic drug: cycloSPORINE Administer 1 drop into both eyes 2 (two) times a day.   rosuvastatin  10 mg tablet Commonly known as: CRESTOR  Take 10 mg by mouth every morning.         Where to Get Your Medications     These medications were sent to [facility]  [address] [place], [place] [address]    Hours: Mon-Fri: 8:30am - 5pm; Sat-Sun: Closed;  Holidays: Closed Phone: [phone]  aspirin 81 mg EC tablet methocarbamoL 500 mg tablet ondansetron  4 mg disintegrating tablet oxyCODONE  5 mg immediate release tablet Senna Plus 8.6-50 mg per tablet       The [place]  Controlled Substance Reporting System was reviewed for this patient prior to providing discharge narcotic prescription.   Activity: activity as tolerated and no driving while on analgesics Diet: regular diet Wound Care: keep wound clean and dry, reinforce dressing PRN, ice to area for comfort, and as directed Weight Bearing Status: NWB with sling to right arm DVT PPX: ASA 81 mg twice daily x 4 weeks    Please contact your doctor or seek medical assistance if you experience any of the following:  1. Fever greater than 101.5 F. 2. Decrease in urinary output. 3. Increased warmth, swelling, redness, or pain at your incision/wound site. 4. Increased drainage or bad odor at your incision/wound site.  5. Nausea/vomiting that does not stop.  6. Numbness, tingling, or discoloration of extremity.  7. Unable to drink fluids.  8. Uncontrollable pain.  9. Any other concerning symptoms.  Please, call 911 or go to your nearest emergency room if you experience any of the following:  1. Change in speech, vision, or ability to walk 2. Chest pain.  3. Difficulty breathing or shortness of breath.  Follow-up:  Future Appointments  Date Time Provider Department Center  03/26/2024 10:00 AM Tia [place], DEVONNA [facility] ORT [facility] [place]  04/23/2024 10:00 AM Morene Gavel, MD [facility] ORT MPM [facility] [place] Abe, FNP  Attending Attestation: I personally reviewed and agree with the assessment and plan as documented in the attached note.  Morene Gavel, MD Hand, Elbow, Shoulder Surgery [facility] 03/15/2024 7:21 AM  Electronically signed by: Morene Gavel, MD 03/15/2024 7:21 AM

## 2024-03-15 ENCOUNTER — Emergency Department (HOSPITAL_BASED_OUTPATIENT_CLINIC_OR_DEPARTMENT_OTHER)

## 2024-03-15 ENCOUNTER — Encounter (HOSPITAL_COMMUNITY): Payer: Self-pay | Admitting: Internal Medicine

## 2024-03-15 ENCOUNTER — Other Ambulatory Visit: Payer: Self-pay

## 2024-03-15 ENCOUNTER — Inpatient Hospital Stay (HOSPITAL_BASED_OUTPATIENT_CLINIC_OR_DEPARTMENT_OTHER)
Admission: EM | Admit: 2024-03-15 | Discharge: 2024-03-17 | DRG: 387 | Disposition: A | Discharge status: Other Acute Inpatient Hospital | Source: Ambulatory Visit | Attending: Internal Medicine | Admitting: Internal Medicine

## 2024-03-15 DIAGNOSIS — Z8249 Family history of ischemic heart disease and other diseases of the circulatory system: Secondary | ICD-10-CM

## 2024-03-15 DIAGNOSIS — Z87892 Personal history of anaphylaxis: Secondary | ICD-10-CM

## 2024-03-15 DIAGNOSIS — Z886 Allergy status to analgesic agent status: Secondary | ICD-10-CM

## 2024-03-15 DIAGNOSIS — I10 Essential (primary) hypertension: Secondary | ICD-10-CM | POA: Diagnosis present

## 2024-03-15 DIAGNOSIS — K56609 Unspecified intestinal obstruction, unspecified as to partial versus complete obstruction: Secondary | ICD-10-CM | POA: Diagnosis present

## 2024-03-15 DIAGNOSIS — K51912 Ulcerative colitis, unspecified with intestinal obstruction: Principal | ICD-10-CM | POA: Diagnosis present

## 2024-03-15 DIAGNOSIS — K56600 Partial intestinal obstruction, unspecified as to cause: Principal | ICD-10-CM | POA: Diagnosis present

## 2024-03-15 DIAGNOSIS — Z7982 Long term (current) use of aspirin: Secondary | ICD-10-CM

## 2024-03-15 DIAGNOSIS — Z9049 Acquired absence of other specified parts of digestive tract: Secondary | ICD-10-CM | POA: Diagnosis not present

## 2024-03-15 DIAGNOSIS — G894 Chronic pain syndrome: Secondary | ICD-10-CM | POA: Diagnosis present

## 2024-03-15 DIAGNOSIS — Z823 Family history of stroke: Secondary | ICD-10-CM | POA: Diagnosis not present

## 2024-03-15 DIAGNOSIS — Z96611 Presence of right artificial shoulder joint: Secondary | ICD-10-CM | POA: Diagnosis present

## 2024-03-15 DIAGNOSIS — K760 Fatty (change of) liver, not elsewhere classified: Secondary | ICD-10-CM | POA: Diagnosis present

## 2024-03-15 DIAGNOSIS — Z79899 Other long term (current) drug therapy: Secondary | ICD-10-CM

## 2024-03-15 DIAGNOSIS — J9811 Atelectasis: Secondary | ICD-10-CM | POA: Diagnosis present

## 2024-03-15 DIAGNOSIS — R1084 Generalized abdominal pain: Secondary | ICD-10-CM | POA: Diagnosis not present

## 2024-03-15 DIAGNOSIS — Z96641 Presence of right artificial hip joint: Secondary | ICD-10-CM | POA: Diagnosis present

## 2024-03-15 DIAGNOSIS — Z880 Allergy status to penicillin: Secondary | ICD-10-CM | POA: Diagnosis not present

## 2024-03-15 DIAGNOSIS — Z743 Need for continuous supervision: Secondary | ICD-10-CM | POA: Diagnosis not present

## 2024-03-15 DIAGNOSIS — Z8719 Personal history of other diseases of the digestive system: Secondary | ICD-10-CM | POA: Diagnosis not present

## 2024-03-15 LAB — CBC
HCT: 34.8 % — ABNORMAL LOW (ref 39.0–52.0)
Hemoglobin: 11.7 g/dL — ABNORMAL LOW (ref 13.0–17.0)
MCH: 29.3 pg (ref 26.0–34.0)
MCHC: 33.6 g/dL (ref 30.0–36.0)
MCV: 87.2 fL (ref 80.0–100.0)
Platelets: 318 K/uL (ref 150–400)
RBC: 3.99 MIL/uL — ABNORMAL LOW (ref 4.22–5.81)
RDW: 16.2 % — ABNORMAL HIGH (ref 11.5–15.5)
WBC: 7.9 K/uL (ref 4.0–10.5)
nRBC: 0 % (ref 0.0–0.2)

## 2024-03-15 LAB — URINALYSIS, ROUTINE W REFLEX MICROSCOPIC
Bilirubin Urine: NEGATIVE
Glucose, UA: NEGATIVE mg/dL
Hgb urine dipstick: NEGATIVE
Ketones, ur: NEGATIVE mg/dL
Leukocytes,Ua: NEGATIVE
Nitrite: NEGATIVE
Specific Gravity, Urine: >1.046 — ABNORMAL HIGH (ref 1.005–1.030)
pH: 6.5 (ref 5.0–8.0)

## 2024-03-15 LAB — COMPREHENSIVE METABOLIC PANEL WITH GFR
ALT: 20 U/L (ref 0–44)
AST: 25 U/L (ref 15–41)
Albumin: 3.5 g/dL (ref 3.5–5.0)
Alkaline Phosphatase: 56 U/L (ref 38–126)
Anion gap: 12 (ref 5–15)
BUN: 14 mg/dL (ref 8–23)
CO2: 26 mmol/L (ref 22–32)
Calcium: 9.2 mg/dL (ref 8.9–10.3)
Chloride: 95 mmol/L — ABNORMAL LOW (ref 98–111)
Creatinine, Ser: 0.67 mg/dL (ref 0.61–1.24)
GFR, Estimated: >60 mL/min (ref >60)
Glucose, Bld: 133 mg/dL — ABNORMAL HIGH (ref 70–99)
Potassium: 3.9 mmol/L (ref 3.5–5.1)
Sodium: 132 mmol/L — ABNORMAL LOW (ref 135–145)
Total Bilirubin: 0.7 mg/dL (ref 0.0–1.2)
Total Protein: 6.5 g/dL (ref 6.5–8.1)

## 2024-03-15 LAB — LIPASE, BLOOD: Lipase: 21 U/L (ref 11–51)

## 2024-03-15 MED ORDER — MORPHINE SULFATE (PF) 4 MG/ML IV SOLN
4.0000 mg | Freq: Once | INTRAVENOUS | Status: AC
  Administered 2024-03-15: 4 mg via INTRAVENOUS
  Filled 2024-03-15: qty 1

## 2024-03-15 MED ORDER — ACETAMINOPHEN 650 MG RE SUPP: 650.0000 mg | Freq: Four times a day (QID) | RECTAL | Status: DC | PRN

## 2024-03-15 MED ORDER — NALOXONE HCL 0.4 MG/ML IJ SOLN: 0.4000 mg | INTRAMUSCULAR | Status: DC | PRN

## 2024-03-15 MED ORDER — HYDROMORPHONE HCL 1 MG/ML IJ SOLN
1.0000 mg | INTRAMUSCULAR | Status: DC | PRN
  Administered 2024-03-15 – 2024-03-17 (×6): 1 mg via INTRAVENOUS
  Filled 2024-03-15 (×6): qty 1

## 2024-03-15 MED ORDER — LACTATED RINGERS IV SOLN: INTRAVENOUS | Status: AC

## 2024-03-15 MED ORDER — BUPIVACAINE ON-Q PAIN PUMP (FOR ORDER SET NO CHG): 1.0000 | INJECTION | Freq: Once | Status: DC

## 2024-03-15 MED ORDER — ONDANSETRON HCL 4 MG/2ML IJ SOLN
4.0000 mg | Freq: Once | INTRAMUSCULAR | Status: AC
  Administered 2024-03-15: 4 mg via INTRAVENOUS
  Filled 2024-03-15: qty 2

## 2024-03-15 MED ORDER — DIATRIZOATE MEGLUMINE & SODIUM 66-10 % PO SOLN
90.0000 mL | Freq: Once | ORAL | Status: AC
Start: 2024-03-15 — End: 2024-03-16
  Administered 2024-03-16: 90 mL via NASOGASTRIC
  Filled 2024-03-15 (×2): qty 90

## 2024-03-15 MED ORDER — SODIUM CHLORIDE 0.9% FLUSH
3.0000 mL | Freq: Two times a day (BID) | INTRAVENOUS | Status: DC
  Administered 2024-03-16 – 2024-03-17 (×3): 3 mL via INTRAVENOUS

## 2024-03-15 MED ORDER — LIDOCAINE HCL URETHRAL/MUCOSAL 2 % EX GEL
1.0000 | Freq: Once | CUTANEOUS | Status: AC
Start: 2024-03-15 — End: 2024-03-15
  Administered 2024-03-15: 1 via INTRATRACHEAL
  Filled 2024-03-15: qty 11

## 2024-03-15 MED ORDER — IOHEXOL 300 MG/ML  SOLN
100.0000 mL | Freq: Once | INTRAMUSCULAR | Status: AC | PRN
  Administered 2024-03-15: 100 mL via INTRAVENOUS

## 2024-03-15 MED ORDER — ACETAMINOPHEN 325 MG PO TABS: 650.0000 mg | ORAL_TABLET | Freq: Four times a day (QID) | ORAL | Status: DC | PRN

## 2024-03-15 MED ORDER — DIAZEPAM 5 MG/ML IJ SOLN
5.0000 mg | Freq: Once | INTRAMUSCULAR | Status: AC
  Administered 2024-03-15: 5 mg via INTRAVENOUS
  Filled 2024-03-15: qty 2

## 2024-03-15 MED ORDER — HYDROMORPHONE HCL 1 MG/ML IJ SOLN
1.0000 mg | Freq: Once | INTRAMUSCULAR | Status: AC
  Administered 2024-03-15: 1 mg via INTRAVENOUS
  Filled 2024-03-15: qty 1

## 2024-03-15 NOTE — ED Notes (Signed)
 ED Provider at bedside.

## 2024-03-15 NOTE — ED Notes (Signed)
 Patient transported to CT

## 2024-03-15 NOTE — H&P (Incomplete)
 History and Physical    [name]. FMW:[id number] DOB: 1960/10/23 DOA: 03/15/2024  PCP: Frederik Charleston, MD   Patient coming from: Home   Chief Complaint:  Chief Complaint  Patient presents with   Abdominal Pain   ED TRIAGE note:  Patient states LLQ abdominal pain for 3 days. States shoulder surgery on Monday. Has not has bowel movement since. Wearing pain pump from shoulder surgery.      63 year old with past medical history of ulcerative colitis with a reanastomosis after small bowel resection by recent shoulder surgery comes in with abdominal pain nausea and no bowel movement, CT of the abdomen pelvis was done that showed partial small bowel obstruction.  General surgery Dr. Ann will see the patient once he arrives to [facility].  NG tube placed and started on the small bowel protocol   HPI:  [name]. is a 63 y.o. male with medical history significant of ulcerative colitis, history of total colectomy, right shoulder replacement, rotator cuff arthroplasty presented to emergency department with complaining of left-sided abdominal pain for last 3 days and having shoulder surgery on Monday.  Patient reported did not have any bowel movement since then.  Patient reported that abdominal pain is constant however it getting being worse with waves.  Did not have any bowel movement since Saturday.  Patient has been taking multiple stool softeners and laxatives without any successful bowel movement.  For last 24 hours patient unable to pass any gas.  Reported having nausea but no vomiting.  Patient has been eating little which is causing significant abdominal pain. Patient reported his arm pain is controlled with bupivacaine  pump and has been taking oxycodone  at home.   ED Course:  At presentation to ED patient is hemodynamically stable. CBC unremarkable.  CMP showing low sodium 132 otherwise unremarkable.  Normal hepatic panel and lipase.  UA unremarkable.   CT abdomen pelvis:   1. Postoperative changes of colectomy with ileoanal anastomosis and pouch formation. 2. Dilated small bowel with relatively abrupt tapering at a segment of inflamed ilium in the right lower quadrant upstream from the anastomosis. Findings suggest early or partial small bowel obstruction. 3. Mucosal thickening and Hyperenhancement with adjacent free fluid about the duodenum suggesting duodenitis. 4. Atelectasis in the posterior right lower lobe. Pneumonia is difficult to exclude. 5. Hepatic steatosis.   ED physician reach out to general surgery recommended to place an NG tube and we will evaluate patient.  In the ED NG tube has been placed and SBO protocol has been initiated. Patient has been transferred to [facility] and informed Dr. Ann will evaluate patient in the morning.  In the ED patient received Dilaudid  1 mg once, currently on Dilaudid  as needed for severe pain control.  Maintenance fluid LR 125 cc/h.  Patient has bupivacaine  pump  Hospitalist has been consulted for further evaluation management of his small bowel obstruction.  Significant labs in the ED: Lab Orders         Lipase, blood         Comprehensive metabolic panel         CBC         Urinalysis, Routine w reflex microscopic -Urine, Clean Catch         HIV Antibody (routine testing w rflx)         Comprehensive metabolic panel         CBC         APTT  Protime-INR       Review of Systems:  ROS  Past Medical History:  Diagnosis Date   Crohn disease (HCC)    Ulcerative colitis (HCC)     Past Surgical History:  Procedure Laterality Date   ABDOMINAL SURGERY  08/2020   ACHILLES TENDON SURGERY     TOTAL HIP ARTHROPLASTY Right    TOTAL SHOULDER REPLACEMENT Right      reports that he has never smoked. He has never used smokeless tobacco. He reports current alcohol use. He reports that he does not use drugs.  Allergies  Allergen Reactions   Nsaids Other (See Comments)   Penicillins  Diarrhea and Other (See Comments)    Family History  Problem Relation Age of Onset   Atrial fibrillation Mother    Heart disease Mother    Hypertension Mother    Stroke Sister    CAD Paternal Grandfather     Prior to Admission medications   Medication Sig Start Date End Date Taking? Authorizing Provider  aspirin EC 81 MG tablet Take 81 mg by mouth 2 (two) times daily. 03/13/24  Yes [provider]  Guselkumab 200 MG/2ML SOAJ Inject 400 mg into the skin every 30 (thirty) days. 10/12/23  Yes [provider]  methocarbamol (ROBAXIN) 500 MG tablet Take 500 mg by mouth 4 (four) times daily as needed for muscle spasms. 03/13/24  Yes [provider]  ondansetron  (ZOFRAN -ODT) 4 MG disintegrating tablet Take 4 mg by mouth every 6 (six) hours as needed for nausea or vomiting. 03/13/24 03/20/24 Yes [provider]  oxyCODONE  (OXY IR/ROXICODONE ) 5 MG immediate release tablet Take 5-10 mg by mouth every 4 (four) hours as needed for moderate pain (pain score 4-6) or severe pain (pain score 7-10). 03/13/24  Yes [provider]  chlorthalidone  (HYGROTON ) 25 MG tablet Take 25 mg by mouth daily. Patient not taking: Reported on 01/02/2021    [provider]  cycloSPORINE (RESTASIS) 0.05 % ophthalmic emulsion Place 1 drop into both eyes in the morning and at bedtime. 07/22/20   [provider]  ferrous sulfate 325 (65 FE) MG tablet Take by mouth.    [provider]  fluticasone (FLONASE) 50 MCG/ACT nasal spray Place 2 sprays into both nostrils as needed. 06/15/18   [provider]  Multiple Vitamin (ONE DAILY) tablet Take 1 tablet by mouth daily.    [provider]  rosuvastatin  (CRESTOR ) 10 MG tablet Take 1 tablet (10 mg total) by mouth daily. 01/02/21   Patwardhan, Newman PARAS, MD  valACYclovir (VALTREX) 1000 MG tablet Take 1,000 mg by mouth 2 (two) times daily.    [provider]     Physical Exam: Vitals:   03/15/24  1615 03/15/24 1730 03/15/24 1926 03/15/24 2240  BP:   (!) 152/78 135/82  Pulse: 95 95 97 (!) 108  Resp:   16 19  Temp:   97.8 F (36.6 C) 98.8 F (37.1 C)  TempSrc:   Oral Oral  SpO2: 92% 96% 96% 92%    Physical Exam   Labs on Admission: I have personally reviewed following labs and imaging studies  CBC: Recent Labs  Lab 03/15/24 1150  WBC 7.9  HGB 11.7*  HCT 34.8*  MCV 87.2  PLT 318   Basic Metabolic Panel: Recent Labs  Lab 03/15/24 1150  NA 132*  K 3.9  CL 95*  CO2 26  GLUCOSE 133*  BUN 14  CREATININE 0.67  CALCIUM  9.2   GFR: CrCl cannot  be calculated (Unknown ideal weight.). Liver Function Tests: Recent Labs  Lab 03/15/24 1150  AST 25  ALT 20  ALKPHOS 56  BILITOT 0.7  PROT 6.5  ALBUMIN 3.5   Recent Labs  Lab 03/15/24 1150  LIPASE 21   No results for input(s): AMMONIA in the last 168 hours. Coagulation Profile: No results for input(s): INR, PROTIME in the last 168 hours. Cardiac Enzymes: No results for input(s): CKTOTAL, CKMB, CKMBINDEX, TROPONINI, TROPONINIHS in the last 168 hours. BNP (last 3 results) No results for input(s): BNP in the last 8760 hours. HbA1C: No results for input(s): HGBA1C in the last 72 hours. CBG: No results for input(s): GLUCAP in the last 168 hours. Lipid Profile: No results for input(s): CHOL, HDL, LDLCALC, TRIG, CHOLHDL, LDLDIRECT in the last 72 hours. Thyroid Function Tests: No results for input(s): TSH, T4TOTAL, FREET4, T3FREE, THYROIDAB in the last 72 hours. Anemia Panel: No results for input(s): VITAMINB12, FOLATE, FERRITIN, TIBC, IRON, RETICCTPCT in the last 72 hours. Urine analysis:    Component Value Date/Time   COLORURINE YELLOW 03/15/2024 1405   APPEARANCEUR CLEAR 03/15/2024 1405   LABSPEC >1.046 (H) 03/15/2024 1405   PHURINE 6.5 03/15/2024 1405   GLUCOSEU NEGATIVE 03/15/2024 1405   HGBUR NEGATIVE 03/15/2024 1405   BILIRUBINUR NEGATIVE  03/15/2024 1405   KETONESUR NEGATIVE 03/15/2024 1405   PROTEINUR TRACE (A) 03/15/2024 1405   UROBILINOGEN 0.2 02/27/2013 0749   NITRITE NEGATIVE 03/15/2024 1405   LEUKOCYTESUR NEGATIVE 03/15/2024 1405    Radiological Exams on Admission: I have personally reviewed images DG Abd Portable 1V-Small Bowel Protocol-Position Verification Result Date: 03/15/2024 CLINICAL DATA:  NG tube EXAM: PORTABLE ABDOMEN - 1 VIEW COMPARISON:  None Available. FINDINGS: Nasogastric tube tip is in the proximal body of the stomach. Distended loops of small bowel in the mid abdomen are again noted. IMPRESSION: Nasogastric tube tip is in the proximal body of the stomach. Electronically Signed   By: Greig Pique M.D.   On: 03/15/2024 18:43   CT ABDOMEN PELVIS W CONTRAST Result Date: 03/15/2024 EXAM: CT ABDOMEN AND PELVIS WITH CONTRAST 03/15/2024 01:13:00 PM TECHNIQUE: CT of the abdomen and pelvis was performed with the administration of 100 mL of iohexol  (OMNIPAQUE ) 300 MG/ML solution. Multiplanar reformatted images are provided for review. Automated exposure control, iterative reconstruction, and/or weight-based adjustment of the mA/kV was utilized to reduce the radiation dose to as low as reasonably achievable. COMPARISON: CT abdomen and pelvis 02/27/2013. CLINICAL HISTORY: LLQ abdominal pain for 3 days. Shoulder surgery on Monday. Has not had bowel movement since. Wearing pain pump from shoulder surgery. FINDINGS: LOWER CHEST: Atelectasis in the posterior right lower lobe. Pneumonia is difficult to exclude. LIVER: Hepatic steatosis. GALLBLADDER AND BILE DUCTS: Gallbladder is unremarkable. No biliary ductal dilatation. SPLEEN: No acute abnormality. PANCREAS: No acute abnormality. ADRENAL GLANDS: No acute abnormality. KIDNEYS, URETERS AND BLADDER: No stones in the kidneys or ureters. No hydronephrosis. No perinephric or periureteral stranding. Urinary bladder is unremarkable. GI AND BOWEL: Postoperative change of colectomy with  ileo-anal anastomosis and pouch formation. Diffuse dilation of the small bowel measuring up to 4.5 cm in diameter. Mild wall thickening and mucosal hyperenhancement of the distal ileum. There is relatively abrupt tapering at the thickened ileal segment upstream from the anastomosis (series 5 image 38). Mucosal thickening and Hyperenhancement with adjacent free fluid about the duodenum suggesting duodenitis. PERITONEUM AND RETROPERITONEUM: Vascular congestion and free fluid in the central mesentery. No free air. VASCULATURE: Aorta is normal in caliber. LYMPH NODES: No lymphadenopathy.  REPRODUCTIVE ORGANS: No acute abnormality. BONES AND SOFT TISSUES: Right hip arthroplasty. No acute osseous abnormality. No focal soft tissue abnormality. IMPRESSION: 1. Postoperative changes of colectomy with ileoanal anastomosis and pouch formation. 2. Dilated small bowel with relatively abrupt tapering at a segment of inflamed ilium in the right lower quadrant upstream from the anastomosis. Findings suggest early or partial small bowel obstruction. 3. Mucosal thickening and Hyperenhancement with adjacent free fluid about the duodenum suggesting duodenitis. 4. Atelectasis in the posterior right lower lobe. Pneumonia is difficult to exclude. 5. Hepatic steatosis. Electronically signed by: Norman Gatlin MD 03/15/2024 01:49 PM EDT RP Workstation: [id number]     EKG: Pending EKG    Assessment/Plan: Principal Problem:   Partial small bowel obstruction (HCC) Active Problems:   Essential hypertension   History of ulcerative colitis   History of total colectomy   Chronic pain syndrome    Assessment and Plan: Partial small bowel obstruction  History of ulcerative colitis History of total colectomy  Recent right shoulder surgery on bupivacaine  pump  Chronic constipation  Essential hypertension    DVT prophylaxis:  SCDs and TED hose.  Deferring pharmacological DVT prophylaxis in case patient will undergo  surgery. Code Status:  Full Code Diet: Keep patient completely n.p.o. Family Communication:  *** Family was present at bedside, at the time of interview.  Opportunity was given to ask question and all questions were answered satisfactorily.  Disposition Plan:  ***  Consults:  ***  Admission status:   Inpatient, Telemetry bed  Severity of Illness: The appropriate patient status for this patient is INPATIENT. Inpatient status is judged to be reasonable and necessary in order to provide the required intensity of service to ensure the patient's safety. The patient's presenting symptoms, physical exam findings, and initial radiographic and laboratory data in the context of their chronic comorbidities is felt to place them at high risk for further clinical deterioration. Furthermore, it is not anticipated that the patient will be medically stable for discharge from the hospital within 2 midnights of admission.   * I certify that at the point of admission it is my clinical judgment that the patient will require inpatient hospital care spanning beyond 2 midnights from the point of admission due to high intensity of service, high risk for further deterioration and high frequency of surveillance required.DEWAINE    Meia Emley, MD [facility]  How to contact the [facility] Attending or Consulting provider 7A - 7P or covering provider during after hours 7P -7A, for this patient.  Check the care team in [facility] and look for a) attending/consulting [facility] provider listed and b) the [facility] team listed Log into [email or web address] and use [place]'s universal password to access. If you do not have the password, please contact the hospital operator. Locate the [facility] provider you are looking for under [facility] and page to a number that you can be directly reached. If you still have difficulty reaching the provider, please page the [facility] (Director on Call) for the Hospitalists listed on amion for  assistance.  03/15/2024, 11:32 PM

## 2024-03-15 NOTE — ED Notes (Signed)
[name] with cl called for transport 

## 2024-03-15 NOTE — ED Provider Notes (Signed)
  Physical Exam  BP 135/80   Pulse 95   Temp 98.2 F (36.8 C)   Resp 16   SpO2 96%   Physical Exam  Procedures  Procedures  ED Course / MDM    Medical Decision Making Amount and/or Complexity of Data Reviewed Labs: ordered. Radiology: ordered.  Risk Prescription drug management.   [name], assumed care for this patient.  In brief, 64 year old male with complicated history of intra-abdominal surgery.  Patient found to have a partial small bowel obstruction.  Signed out pending general surgery consultation.  I spoke with Dr. Ann from general surgery who reviewed the patient's imaging.  She requested NG tube, Gastrografin  follow-through.  Believe patient is appropriate for admitting to the hospitalist service.  Have ordered a lidocaine  jet and some Valium  to help facilitate the procedure.  Will admit to hospitalist service.       Perry Fairy T, DO 03/15/24 1742

## 2024-03-15 NOTE — ED Notes (Addendum)
 Pt placed on 2 L of oxygen via [place] due to O2 sat in low 90s while on Room Air. Pt denies SOB.

## 2024-03-15 NOTE — ED Notes (Signed)
 [facility] at bedside

## 2024-03-15 NOTE — H&P (Incomplete)
 Reason for Consult:  SBO Referring Provider: Lee, MD  HPI  Noah Michels. is an 63 y.o. 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  Report initially for Ulcerative colitis, but on review of most recent GI notes from [facility], there is mention of Crohn's disease. Patient currently on guselkumab for IBD.  Patient states that he had shoulder surgery on the 22nd of this month, and began to have abdominal pain after his surgery.  Patient states that his last bowel movement was on Sunday.  Patient states that he last passed gas yesterday.  Patient has been taking laxatives and stool softeners without any relief of his symptoms.  Patient has had nausea but no vomiting.  Patient does have abdominal pain in his left lower quadrant.  CT scan obtained showed concern for small bowel obstruction.  There seems to be tapering near a segment of inflamed ileum that is proximal to his ileoanal anastomosis.  The distal ileum does appear to be somewhat thickened and hyperemic.  There is some thickening and hyperemia near his duodenum as well.  No leukocytosis.  Patient states that he feels much better after the NG tube was placed.  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  10 point review of systems is negative except as listed above in HPI.  Objective  Past Medical History: Past Medical History:  Diagnosis Date   Crohn disease (HCC)    Ulcerative colitis (HCC)     Past Surgical History: Past Surgical History:  Procedure Laterality Date   ABDOMINAL SURGERY  08/2020   ACHILLES TENDON SURGERY     TOTAL HIP ARTHROPLASTY Right    TOTAL SHOULDER REPLACEMENT Right     Family History:  Family History  Problem Relation Age of Onset   Atrial  fibrillation Mother    Heart disease Mother    Hypertension Mother    Stroke Sister    CAD Paternal Grandfather     Social History:  reports that he has never smoked. He has never used smokeless tobacco. He reports current alcohol use. He reports that he does not use drugs.  Allergies:  Allergies  Allergen Reactions   Nsaids Other (See Comments)   Penicillins Diarrhea and Other (See Comments)    Medications: I have reviewed the patient's current medications.  Labs: I have personally reviewed all labs for the past 24h  Imaging: I have personally reviewed and interpreted all imaging for the past 24h and agree with the radiologist's impression.  DG Abd Portable 1V-Small Bowel Protocol-Position Verification Result Date: 03/15/2024 CLINICAL DATA:  NG tube EXAM: PORTABLE ABDOMEN - 1 VIEW COMPARISON:  None Available. FINDINGS: Nasogastric tube tip is in the proximal body of the stomach. Distended loops of small bowel in the mid abdomen are again noted. IMPRESSION: Nasogastric tube tip is in the proximal body of the stomach. Electronically Signed   By: Greig Pique M.D.   On: 03/15/2024 18:43   CT ABDOMEN PELVIS W CONTRAST Result Date: 03/15/2024 EXAM: CT ABDOMEN AND PELVIS WITH CONTRAST 03/15/2024 01:13:00 PM TECHNIQUE: CT of the abdomen and pelvis was performed with the administration of 100 mL of iohexol  (OMNIPAQUE ) 300 MG/ML solution. Multiplanar reformatted images are provided for review. Automated exposure control, iterative reconstruction, and/or weight-based adjustment of the  mA/kV was utilized to reduce the radiation dose to as low as reasonably achievable. COMPARISON: CT abdomen and pelvis 02/27/2013. CLINICAL HISTORY: LLQ abdominal pain for 3 days. Shoulder surgery on Monday. Has not had bowel movement since. Wearing pain pump from shoulder surgery. FINDINGS: LOWER CHEST: Atelectasis in the posterior right lower lobe. Pneumonia is difficult to exclude. LIVER: Hepatic steatosis.  GALLBLADDER AND BILE DUCTS: Gallbladder is unremarkable. No biliary ductal dilatation. SPLEEN: No acute abnormality. PANCREAS: No acute abnormality. ADRENAL GLANDS: No acute abnormality. KIDNEYS, URETERS AND BLADDER: No stones in the kidneys or ureters. No hydronephrosis. No perinephric or periureteral stranding. Urinary bladder is unremarkable. GI AND BOWEL: Postoperative change of colectomy with ileo-anal anastomosis and pouch formation. Diffuse dilation of the small bowel measuring up to 4.5 cm in diameter. Mild wall thickening and mucosal hyperenhancement of the distal ileum. There is relatively abrupt tapering at the thickened ileal segment upstream from the anastomosis (series 5 image 38). Mucosal thickening and Hyperenhancement with adjacent free fluid about the duodenum suggesting duodenitis. PERITONEUM AND RETROPERITONEUM: Vascular congestion and free fluid in the central mesentery. No free air. VASCULATURE: Aorta is normal in caliber. LYMPH NODES: No lymphadenopathy. REPRODUCTIVE ORGANS: No acute abnormality. BONES AND SOFT TISSUES: Right hip arthroplasty. No acute osseous abnormality. No focal soft tissue abnormality. IMPRESSION: 1. Postoperative changes of colectomy with ileoanal anastomosis and pouch formation. 2. Dilated small bowel with relatively abrupt tapering at a segment of inflamed ilium in the right lower quadrant upstream from the anastomosis. Findings suggest early or partial small bowel obstruction. 3. Mucosal thickening and Hyperenhancement with adjacent free fluid about the duodenum suggesting duodenitis. 4. Atelectasis in the posterior right lower lobe. Pneumonia is difficult to exclude. 5. Hepatic steatosis. Electronically signed by: Norman Gatlin MD 03/15/2024 01:49 PM EDT RP Workstation: [id number]     Physical Exam Blood pressure 135/82, pulse (!) 108, temperature 98.8 F (37.1 C), temperature source Oral, resp. rate 19, SpO2 92%. General: no acute distress HEENT:  normocephalic, atraumatic Oropharynx: mucous membranes dry CV: Sinus tachycardia, normotensive Chest: equal chest rise bilaterally normal respiratory effort on room air Abdomen: soft and moderately distended, mild tenderness to palpation in LLQ. Extremities: RUE in sling, post-op Skin: warm, dry, no rashes Psych: normal memory, normal mood/affect  Neuro: No focal neurologic deficits, A&Ox3    Assessment   [name]. is an 63 y.o. male with history of UC s/p TAC and ileoanal anastomosis presenting with SBO  Plan  - NPO, IVF per [facility] - NGT to LIWS - Small bowel protocol ordered - Discussed with patient if he does not improved with nonoperative management, may need an operation this admission. - Will discuss in AM whether to involve GI this admission given the thickened and hyperemic areas of intestine seen on CT may be related to his IBD and contributing to symptoms. - DVT - SCDs, ok for DVT ppx from surgical perspective - Dispo - med-surg   I reviewed ED provider notes, hospitalist notes, last 24 h vitals and pain scores, last 48 h intake and output, last 24 h labs and trends, last 24 h imaging results, and GI pouchoscopy notes from 2024 and 2025, surgical operative notes from incisional hernia repair in 2022.  This care required moderate level of medical decision making.   Orie Silversmith, MD [facility] Surgery

## 2024-03-15 NOTE — ED Provider Notes (Signed)
 [place] EMERGENCY DEPARTMENT AT [facility] Provider Note   CSN: [id number] Arrival date & time: 03/15/24  1129     Patient presents with: Abdominal Pain   [name]. is a 63 y.o. male.   Patient is a 63 year old male with a history of ulcerative colitis status post hemicolectomy with a pouch and then reversal surgery who had a shoulder surgery done on Monday and reports for the last 3 days has had worsening left lower quadrant abdominal pain.  The pain is constant but then gets waves of being severe.  He has not had a bowel movement since Saturday.  He has been taking multiple stool softeners and laxatives but has not been able to have a bowel movement.  He reports in the last 24 hours he has stopped passing gas.  He has had nausea but no vomiting.  He has been eating very little and if he does eat it makes his stomach hurt worse.  No complaints of any urinary issues.  He denies any back pain.  Pain in his arm is being controlled by bupivacaine  pump and he has had a few oxycodone  at home but not regular doses.  No fever, cough or shortness of breath.  The history is provided by the patient.  Abdominal Pain      Prior to Admission medications   Medication Sig Start Date End Date Taking? Authorizing Provider  aspirin EC 81 MG tablet Take 81 mg by mouth 2 (two) times daily. 03/13/24  Yes [provider]  Guselkumab 200 MG/2ML SOAJ Inject 400 mg into the skin every 30 (thirty) days. 10/12/23  Yes [provider]  methocarbamol (ROBAXIN) 500 MG tablet Take 500 mg by mouth 4 (four) times daily as needed for muscle spasms. 03/13/24  Yes [provider]  ondansetron  (ZOFRAN -ODT) 4 MG disintegrating tablet Take 4 mg by mouth every 6 (six) hours as needed for nausea or vomiting. 03/13/24 03/20/24 Yes [provider]  oxyCODONE  (OXY IR/ROXICODONE ) 5 MG immediate release tablet Take 5-10 mg by mouth every 4 (four) hours as needed for moderate pain  (pain score 4-6) or severe pain (pain score 7-10). 03/13/24  Yes [provider]  chlorthalidone  (HYGROTON ) 25 MG tablet Take 25 mg by mouth daily. Patient not taking: Reported on 01/02/2021    [provider]  cycloSPORINE (RESTASIS) 0.05 % ophthalmic emulsion Place 1 drop into both eyes in the morning and at bedtime. 07/22/20   [provider]  ferrous sulfate 325 (65 FE) MG tablet Take by mouth.    [provider]  fluticasone (FLONASE) 50 MCG/ACT nasal spray Place 2 sprays into both nostrils as needed. 06/15/18   [provider]  Multiple Vitamin (ONE DAILY) tablet Take 1 tablet by mouth daily.    [provider]  rosuvastatin  (CRESTOR ) 10 MG tablet Take 1 tablet (10 mg total) by mouth daily. 01/02/21   Patwardhan, Newman PARAS, MD  valACYclovir (VALTREX) 1000 MG tablet Take 1,000 mg by mouth 2 (two) times daily.    [provider]    Allergies: Nsaids and Penicillins    Review of Systems  Gastrointestinal:  Positive for abdominal pain.    Updated Vital Signs BP (!) 147/91   Pulse 97   Temp 98.2 F (36.8 C)   Resp 16   SpO2 94%   Physical Exam Vitals and nursing note reviewed.  Constitutional:      General: He is not in acute distress.  Appearance: He is well-developed.  HENT:     Head: Normocephalic and atraumatic.  Eyes:     Conjunctiva/sclera: Conjunctivae normal.     Pupils: Pupils are equal, round, and reactive to light.  Cardiovascular:     Rate and Rhythm: Normal rate and regular rhythm.     Heart sounds: No murmur heard. Pulmonary:     Effort: Pulmonary effort is normal. No respiratory distress.     Breath sounds: Normal breath sounds. No wheezing or rales.  Abdominal:     General: Bowel sounds are decreased. There is no distension.     Palpations: Abdomen is soft.     Tenderness: There is abdominal tenderness in the left lower quadrant. There is guarding. There is no left CVA tenderness or rebound.   Musculoskeletal:        General: No tenderness. Normal range of motion.     Cervical back: Normal range of motion and neck supple.  Skin:    General: Skin is warm and dry.     Findings: No erythema or rash.  Neurological:     Mental Status: He is alert and oriented to person, place, and time.  Psychiatric:        Behavior: Behavior normal.     (all labs ordered are listed, but only abnormal results are displayed) Labs Reviewed  COMPREHENSIVE METABOLIC PANEL WITH GFR - Abnormal; Notable for the following components:      Result Value   Sodium 132 (*)    Chloride 95 (*)    Glucose, Bld 133 (*)    All other components within normal limits  CBC - Abnormal; Notable for the following components:   RBC 3.99 (*)    Hemoglobin 11.7 (*)    HCT 34.8 (*)    RDW 16.2 (*)    All other components within normal limits  URINALYSIS, ROUTINE W REFLEX MICROSCOPIC - Abnormal; Notable for the following components:   Specific Gravity, Urine >1.046 (*)    Protein, ur TRACE (*)    All other components within normal limits  LIPASE, BLOOD    EKG: None  Radiology: CT ABDOMEN PELVIS W CONTRAST Result Date: 03/15/2024 EXAM: CT ABDOMEN AND PELVIS WITH CONTRAST 03/15/2024 01:13:00 PM TECHNIQUE: CT of the abdomen and pelvis was performed with the administration of 100 mL of iohexol  (OMNIPAQUE ) 300 MG/ML solution. Multiplanar reformatted images are provided for review. Automated exposure control, iterative reconstruction, and/or weight-based adjustment of the mA/kV was utilized to reduce the radiation dose to as low as reasonably achievable. COMPARISON: CT abdomen and pelvis 02/27/2013. CLINICAL HISTORY: LLQ abdominal pain for 3 days. Shoulder surgery on Monday. Has not had bowel movement since. Wearing pain pump from shoulder surgery. FINDINGS: LOWER CHEST: Atelectasis in the posterior right lower lobe. Pneumonia is difficult to exclude. LIVER: Hepatic steatosis. GALLBLADDER AND BILE DUCTS: Gallbladder is  unremarkable. No biliary ductal dilatation. SPLEEN: No acute abnormality. PANCREAS: No acute abnormality. ADRENAL GLANDS: No acute abnormality. KIDNEYS, URETERS AND BLADDER: No stones in the kidneys or ureters. No hydronephrosis. No perinephric or periureteral stranding. Urinary bladder is unremarkable. GI AND BOWEL: Postoperative change of colectomy with ileo-anal anastomosis and pouch formation. Diffuse dilation of the small bowel measuring up to 4.5 cm in diameter. Mild wall thickening and mucosal hyperenhancement of the distal ileum. There is relatively abrupt tapering at the thickened ileal segment upstream from the anastomosis (series 5 image 38). Mucosal thickening and Hyperenhancement with adjacent free fluid about the duodenum suggesting duodenitis. PERITONEUM AND RETROPERITONEUM: Vascular  congestion and free fluid in the central mesentery. No free air. VASCULATURE: Aorta is normal in caliber. LYMPH NODES: No lymphadenopathy. REPRODUCTIVE ORGANS: No acute abnormality. BONES AND SOFT TISSUES: Right hip arthroplasty. No acute osseous abnormality. No focal soft tissue abnormality. IMPRESSION: 1. Postoperative changes of colectomy with ileoanal anastomosis and pouch formation. 2. Dilated small bowel with relatively abrupt tapering at a segment of inflamed ilium in the right lower quadrant upstream from the anastomosis. Findings suggest early or partial small bowel obstruction. 3. Mucosal thickening and Hyperenhancement with adjacent free fluid about the duodenum suggesting duodenitis. 4. Atelectasis in the posterior right lower lobe. Pneumonia is difficult to exclude. 5. Hepatic steatosis. Electronically signed by: Norman Gatlin MD 03/15/2024 01:49 PM EDT RP Workstation: [id number]     Procedures   Medications Ordered in the ED  lactated ringers  infusion (has no administration in time range)  HYDROmorphone  (DILAUDID ) injection 1 mg (has no administration in time range)  morphine  (PF) 4 MG/ML injection  4 mg (4 mg Intravenous Given 03/15/24 1223)  ondansetron  (ZOFRAN ) injection 4 mg (4 mg Intravenous Given 03/15/24 1223)  iohexol  (OMNIPAQUE ) 300 MG/ML solution 100 mL (100 mLs Intravenous Contrast Given 03/15/24 1313)                                    Medical Decision Making Amount and/or Complexity of Data Reviewed External Data Reviewed: notes. Labs: ordered. Decision-making details documented in ED Course. Radiology: ordered and independent interpretation performed. Decision-making details documented in ED Course.  Risk Prescription drug management.   Pt with multiple medical problems and comorbidities and presenting today with a complaint that caries a high risk for morbidity and mortality.  Here today with left lower quadrant pain in the setting of having ulcerative colitis with a hemicolectomy and pouch reversal who now is unable to have a bowel movement and has severe pain.  This occurred after surgery and concern for ileus, obstruction, abscess or enteritis.  Patient is not having any infectious symptoms at this time.  He was given pain control.  Labs and imaging are pending.  2:51 PM I independently interpreted patient's labs and CBC, lipase, UA, CMP all without acute findings.  I have independently visualized and interpreted pt's images today.  CT today with concern for bowel obstruction.  Radiology reports postoperative changes colectomy with ileoanal anastomosis and pouch formation as well as dilated small bowel with relatively abrupt tapering at a segment of inflamed ileum in the right lower quadrant upstream from the anastomosis concerning for early or partial small bowel obstruction.  Patient is still having abdominal pain but has not had any vomiting here.  Will discuss the case with general surgery for consult but feel that patient will need admission for the above findings for bowel rest and IV fluids.  He was started on a rate of fluids and given further pain control.  Will  consult medicine for admission.  Patient and his wife are comfortable with this plan.     Final diagnoses:  Partial small bowel obstruction [facility])    ED Discharge Orders     None          Doretha Folks, MD 03/15/24 1535

## 2024-03-15 NOTE — ED Triage Notes (Signed)
 Patient states LLQ abdominal pain for 3 days. States shoulder surgery on Monday. Has not has bowel movement since. Wearing pain pump from shoulder surgery.

## 2024-03-16 ENCOUNTER — Inpatient Hospital Stay (HOSPITAL_COMMUNITY)

## 2024-03-16 ENCOUNTER — Other Ambulatory Visit: Payer: Self-pay

## 2024-03-16 DIAGNOSIS — K56600 Partial intestinal obstruction, unspecified as to cause: Secondary | ICD-10-CM

## 2024-03-16 LAB — COMPREHENSIVE METABOLIC PANEL WITH GFR
ALT: 18 U/L (ref 0–44)
AST: 19 U/L (ref 15–41)
Albumin: 2.6 g/dL — ABNORMAL LOW (ref 3.5–5.0)
Alkaline Phosphatase: 46 U/L (ref 38–126)
Anion gap: 12 (ref 5–15)
BUN: 16 mg/dL (ref 8–23)
CO2: 28 mmol/L (ref 22–32)
Calcium: 8.6 mg/dL — ABNORMAL LOW (ref 8.9–10.3)
Chloride: 95 mmol/L — ABNORMAL LOW (ref 98–111)
Creatinine, Ser: 0.87 mg/dL (ref 0.61–1.24)
GFR, Estimated: >60 mL/min (ref >60)
Glucose, Bld: 115 mg/dL — ABNORMAL HIGH (ref 70–99)
Potassium: 3.8 mmol/L (ref 3.5–5.1)
Sodium: 135 mmol/L (ref 135–145)
Total Bilirubin: 1.2 mg/dL (ref 0.0–1.2)
Total Protein: 5.9 g/dL — ABNORMAL LOW (ref 6.5–8.1)

## 2024-03-16 LAB — HIV ANTIBODY (ROUTINE TESTING W REFLEX): HIV Screen 4th Generation wRfx: NONREACTIVE

## 2024-03-16 LAB — CBC
HCT: 35.1 % — ABNORMAL LOW (ref 39.0–52.0)
Hemoglobin: 11.4 g/dL — ABNORMAL LOW (ref 13.0–17.0)
MCH: 28.6 pg (ref 26.0–34.0)
MCHC: 32.5 g/dL (ref 30.0–36.0)
MCV: 88 fL (ref 80.0–100.0)
Platelets: 324 K/uL (ref 150–400)
RBC: 3.99 MIL/uL — ABNORMAL LOW (ref 4.22–5.81)
RDW: 16.1 % — ABNORMAL HIGH (ref 11.5–15.5)
WBC: 5.6 K/uL (ref 4.0–10.5)
nRBC: 0 % (ref 0.0–0.2)

## 2024-03-16 LAB — PROTIME-INR
INR: 1 (ref 0.8–1.2)
Prothrombin Time: 14 s (ref 11.4–15.2)

## 2024-03-16 LAB — APTT: aPTT: 32 s (ref 24–36)

## 2024-03-16 MED ORDER — HYDRALAZINE HCL 20 MG/ML IJ SOLN: 10.0000 mg | Freq: Four times a day (QID) | INTRAMUSCULAR | Status: DC | PRN

## 2024-03-16 MED ORDER — ENOXAPARIN SODIUM 40 MG/0.4ML IJ SOSY
40.0000 mg | PREFILLED_SYRINGE | INTRAMUSCULAR | Status: DC
Start: 2024-03-16 — End: 2024-03-16

## 2024-03-16 MED ORDER — ENOXAPARIN SODIUM 40 MG/0.4ML IJ SOSY
40.0000 mg | PREFILLED_SYRINGE | INTRAMUSCULAR | Status: DC
  Administered 2024-03-17: 40 mg via SUBCUTANEOUS
  Filled 2024-03-16: qty 0.4

## 2024-03-16 MED ORDER — ACETAMINOPHEN 10 MG/ML IV SOLN
1000.0000 mg | Freq: Four times a day (QID) | INTRAVENOUS | Status: AC
  Administered 2024-03-16: 1000 mg via INTRAVENOUS
  Filled 2024-03-16: qty 100

## 2024-03-16 NOTE — Progress Notes (Signed)
 Pt comfortable this PM Less distended with NGT and pain better.  Soft but distended abdomen.  Pt has a colorectal surgeon at [facility] that has done much of his surgery with respect to his pouch. He desires transfer to [facility] for continuity purposes.

## 2024-03-16 NOTE — Progress Notes (Signed)
 bupivacaine  ON-Q pain pump  has been removed.

## 2024-03-16 NOTE — Discharge Summary (Addendum)
 Physician Discharge Summary  [name]. FMW:[id number] DOB: 1961/06/02 DOA: 03/15/2024  PCP: Frederik Charleston, MD  Admit date: 03/15/2024 Discharge date: 03/17/2024  Admitted From: home Disposition:  transfer to [facility] when bed available. Accepting MD is Dr Merrilyn  Recommendations for Outpatient Follow-up:  Follow up with PCP in 1-2 weeks  Home Health: none Equipment/Devices: none  Discharge Condition: stable CODE STATUS: Full code Diet Orders (From admission, onward)     Start     Ordered   03/15/24 2326  Diet NPO time specified  Diet effective now        03/15/24 2325            Brief Narrative / Interim history: 63 year old male with history of Crohn's disease status post total colectomy and ileal pouch, complicated by ileal pouchitis, now controlled with guselkumab and supposed to have follow-up with [facility] October 2025, iron deficiency anemia, osteoarthritis status post recent right shoulder surgery on Monday comes into the hospital with abdominal pain, left lower quadrant, inability to have any bowel movements or passing gas since his surgery.  On admission, he was found to have a small bowel obstruction, general surgery was consulted and he was admitted to the hospital for  Hospital Course / Discharge diagnoses: Principal problem Partial small bowel obstruction -patient with complex GI history and main surgeon is at [facility]. General surgery was consulted here, patient evaluated and currently under conservative treatment with NG tube, n.p.o., IV fluids and close monitoring. Patient asking to be transferred to [facility] since his main surgeon is there, called [facility], accepted in transfer by Dr Merrilyn.    Active problems History of ulcerative colitis, total colectomy, ileal pouch -apparently controlled as an outpatient on guselkumab.  Has follow-up with GI in October 2024 and at [facility] Recent right shoulder surgery -on bupivacaine  pump, this was discontinued by  patient and family at the indication of his surgeon over the phone.   Essential hypertension-hold oral agents due to n.p.o.  On hydralazine  prn  Sepsis ruled out   Discharge Instructions   Allergies as of 03/17/2024       Reactions   Avocado Anaphylaxis   Iron Dextran Anaphylaxis   Tolerated Feraheme with no issues; given acetaminophen , diphenhydramine, and famotidine as premeds   Mercaptopurine Other (See Comments)   Unknown    Naproxen Swelling   Swelling of eyes    Nsaids Hives, Swelling, Dermatitis   Penicillins Diarrhea   Ibuprofen Hives, Dermatitis, Other (See Comments)   Swelling of eyes         Medication List     TAKE these medications    albuterol 108 (90 Base) MCG/ACT inhaler Commonly known as: VENTOLIN HFA Inhale 1 puff into the lungs every 6 (six) hours as needed for shortness of breath.   aspirin EC 81 MG tablet Take 81 mg by mouth 2 (two) times daily.   ciprofloxacin 500 MG tablet Commonly known as: CIPRO Take 500 mg by mouth 2 (two) times daily.   cycloSPORINE 0.05 % ophthalmic emulsion Commonly known as: RESTASIS Place 1 drop into both eyes in the morning and at bedtime.   fluticasone 50 MCG/ACT nasal spray Commonly known as: FLONASE Place 2 sprays into both nostrils daily.   Guselkumab 200 MG/2ML Soaj Inject 400 mg into the skin every 30 (thirty) days.   methocarbamol 500 MG tablet Commonly known as: ROBAXIN Take 500 mg by mouth 4 (four) times daily as needed for muscle spasms.  ondansetron  4 MG disintegrating tablet Commonly known as: ZOFRAN -ODT Take 4 mg by mouth every 6 (six) hours as needed for nausea or vomiting.   One Daily tablet Take 1 tablet by mouth daily.   oxyCODONE  5 MG immediate release tablet Commonly known as: Oxy IR/ROXICODONE  Take 5-10 mg by mouth every 4 (four) hours as needed for moderate pain (pain score 4-6) or severe pain (pain score 7-10).   rosuvastatin  10 MG tablet Commonly known as: CRESTOR  Take 1  tablet (10 mg total) by mouth daily.          Consultations: General surgery   Procedures/Studies:  DG Abd Portable 1V Result Date: 03/17/2024 CLINICAL DATA:  [id number] SBO (small bowel obstruction) (HCC) [id number] EXAM: PORTABLE ABDOMEN - 1 VIEW COMPARISON:  KUB, most recently 03/16/2024.  CT AP, 03/15/2024. FINDINGS: Support lines; stable positioning of enteric decompression tube, projecting over the expected location of the stomach. Persistent, diffuse air distention of small bowel, similar appearance to recent comparison. Surgical staple line and clips at the pelvis. No evidence of free intraperitoneal air. RIGHT hip arthroplasty. No interval osseous abnormality. IMPRESSION: 1. Stable lines and tubes. 2. Persistent, diffuse distention of the small bowel similar to recent comparison. No evidence of free intraperitoneal air. Electronically Signed   By: Thom Hall M.D.   On: 03/17/2024 08:23   DG Abd Portable 1V-Small Bowel Obstruction Protocol-initial, 8 hr delay Result Date: 03/16/2024 CLINICAL DATA:  Small bowel obstruction. EXAM: PORTABLE ABDOMEN - 1 VIEW COMPARISON:  03/15/2024 as well as CT 03/15/2024 FINDINGS: Sutures suture line over the bowel in the right pelvis. Nasogastric tube has tip over the stomach in the left upper quadrant. Bowel gas pattern continues demonstrate several air-filled dilated small bowel loops without significant change. Remainder of the exam is unchanged. IMPRESSION: Stable compatible with suggesting persistent early/partial small bowel obstruction. Electronically Signed   By: Toribio Agreste M.D.   On: 03/16/2024 08:43   DG Abd Portable 1V-Small Bowel Protocol-Position Verification Result Date: 03/15/2024 CLINICAL DATA:  NG tube EXAM: PORTABLE ABDOMEN - 1 VIEW COMPARISON:  None Available. FINDINGS: Nasogastric tube tip is in the proximal body of the stomach. Distended loops of small bowel in the mid abdomen are again noted. IMPRESSION: Nasogastric tube tip is in the  proximal body of the stomach. Electronically Signed   By: Greig Pique M.D.   On: 03/15/2024 18:43   CT ABDOMEN PELVIS W CONTRAST Result Date: 03/15/2024 EXAM: CT ABDOMEN AND PELVIS WITH CONTRAST 03/15/2024 01:13:00 PM TECHNIQUE: CT of the abdomen and pelvis was performed with the administration of 100 mL of iohexol  (OMNIPAQUE ) 300 MG/ML solution. Multiplanar reformatted images are provided for review. Automated exposure control, iterative reconstruction, and/or weight-based adjustment of the mA/kV was utilized to reduce the radiation dose to as low as reasonably achievable. COMPARISON: CT abdomen and pelvis 02/27/2013. CLINICAL HISTORY: LLQ abdominal pain for 3 days. Shoulder surgery on Monday. Has not had bowel movement since. Wearing pain pump from shoulder surgery. FINDINGS: LOWER CHEST: Atelectasis in the posterior right lower lobe. Pneumonia is difficult to exclude. LIVER: Hepatic steatosis. GALLBLADDER AND BILE DUCTS: Gallbladder is unremarkable. No biliary ductal dilatation. SPLEEN: No acute abnormality. PANCREAS: No acute abnormality. ADRENAL GLANDS: No acute abnormality. KIDNEYS, URETERS AND BLADDER: No stones in the kidneys or ureters. No hydronephrosis. No perinephric or periureteral stranding. Urinary bladder is unremarkable. GI AND BOWEL: Postoperative change of colectomy with ileo-anal anastomosis and pouch formation. Diffuse dilation of the small bowel measuring up to 4.5 cm in diameter.  Mild wall thickening and mucosal hyperenhancement of the distal ileum. There is relatively abrupt tapering at the thickened ileal segment upstream from the anastomosis (series 5 image 38). Mucosal thickening and Hyperenhancement with adjacent free fluid about the duodenum suggesting duodenitis. PERITONEUM AND RETROPERITONEUM: Vascular congestion and free fluid in the central mesentery. No free air. VASCULATURE: Aorta is normal in caliber. LYMPH NODES: No lymphadenopathy. REPRODUCTIVE ORGANS: No acute abnormality.  BONES AND SOFT TISSUES: Right hip arthroplasty. No acute osseous abnormality. No focal soft tissue abnormality. IMPRESSION: 1. Postoperative changes of colectomy with ileoanal anastomosis and pouch formation. 2. Dilated small bowel with relatively abrupt tapering at a segment of inflamed ilium in the right lower quadrant upstream from the anastomosis. Findings suggest early or partial small bowel obstruction. 3. Mucosal thickening and Hyperenhancement with adjacent free fluid about the duodenum suggesting duodenitis. 4. Atelectasis in the posterior right lower lobe. Pneumonia is difficult to exclude. 5. Hepatic steatosis. Electronically signed by: Norman Gatlin MD 03/15/2024 01:49 PM EDT RP Workstation: [id number]     Subjective: - no chest pain, shortness of breath, no abdominal pain, nausea or vomiting.   Discharge Exam: BP 128/67 (BP Location: Left Arm)   Pulse 98   Temp 98.6 F (37 C) (Oral)   Resp 18   Ht 6' (1.829 m)   SpO2 94%   BMI 28.21 kg/m   General: Pt is alert, awake, not in acute distress Cardiovascular: RRR, S1/S2 +, no rubs, no gallops Respiratory: CTA bilaterally, no wheezing, no rhonchi Abdominal: Soft, NT, ND, bowel sounds + Extremities: no edema, no cyanosis    The results of significant diagnostics from this hospitalization (including imaging, microbiology, ancillary and laboratory) are listed below for reference.     Microbiology: No results found for this or any previous visit (from the past 240 hours).   Labs: Basic Metabolic Panel: Recent Labs  Lab 03/15/24 1150 03/16/24 0539 03/17/24 0537  NA 132* 135 137  K 3.9 3.8 3.3*  CL 95* 95* 95*  CO2 26 28 28   GLUCOSE 133* 115* 95  BUN 14 16 18   CREATININE 0.67 0.87 1.02  CALCIUM  9.2 8.6* 8.2*  MG  --   --  2.2   Liver Function Tests: Recent Labs  Lab 03/15/24 1150 03/16/24 0539 03/17/24 0537  AST 25 19 16   ALT 20 18 17   ALKPHOS 56 46 43  BILITOT 0.7 1.2 1.1  PROT 6.5 5.9* 5.6*  ALBUMIN  3.5 2.6* 2.4*   CBC: Recent Labs  Lab 03/15/24 1150 03/16/24 0539 03/17/24 0537  WBC 7.9 5.6 4.1  HGB 11.7* 11.4* 10.4*  HCT 34.8* 35.1* 32.5*  MCV 87.2 88.0 88.6  PLT 318 324 337   CBG: No results for input(s): GLUCAP in the last 168 hours. Hgb A1c No results for input(s): HGBA1C in the last 72 hours. Lipid Profile No results for input(s): CHOL, HDL, LDLCALC, TRIG, CHOLHDL, LDLDIRECT in the last 72 hours. Thyroid function studies No results for input(s): TSH, T4TOTAL, T3FREE, THYROIDAB in the last 72 hours.  Invalid input(s): FREET3 Urinalysis    Component Value Date/Time   COLORURINE YELLOW 03/15/2024 1405   APPEARANCEUR CLEAR 03/15/2024 1405   LABSPEC >1.046 (H) 03/15/2024 1405   PHURINE 6.5 03/15/2024 1405   GLUCOSEU NEGATIVE 03/15/2024 1405   HGBUR NEGATIVE 03/15/2024 1405   BILIRUBINUR NEGATIVE 03/15/2024 1405   KETONESUR NEGATIVE 03/15/2024 1405   PROTEINUR TRACE (A) 03/15/2024 1405   UROBILINOGEN 0.2 02/27/2013 0749   NITRITE NEGATIVE 03/15/2024 1405  LEUKOCYTESUR NEGATIVE 03/15/2024 1405    FURTHER DISCHARGE INSTRUCTIONS:   Get Medicines reviewed and adjusted: Please take all your medications with you for your next visit with your Primary MD   Laboratory/radiological data: Please request your Primary MD to go over all hospital tests and procedure/radiological results at the follow up, please ask your Primary MD to get all Hospital records sent to his/her office.   In some cases, they will be blood work, cultures and biopsy results pending at the time of your discharge. Please request that your primary care M.D. goes through all the records of your hospital data and follows up on these results.   Also Note the following: If you experience worsening of your admission symptoms, develop shortness of breath, life threatening emergency, suicidal or homicidal thoughts you must seek medical attention immediately by calling 911 or calling  your MD immediately  if symptoms less severe.   You must read complete instructions/literature along with all the possible adverse reactions/side effects for all the Medicines you take and that have been prescribed to you. Take any new Medicines after you have completely understood and accpet all the possible adverse reactions/side effects.    Do not drive when taking Pain medications or sleeping medications (Benzodaizepines)   Do not take more than prescribed Pain, Sleep and Anxiety Medications. It is not advisable to combine anxiety,sleep and pain medications without talking with your primary care practitioner   Special Instructions: If you have smoked or chewed Tobacco  in the last 2 yrs please stop smoking, stop any regular Alcohol  and or any Recreational drug use.   Wear Seat belts while driving.   Please note: You were cared for by a hospitalist during your hospital stay. Once you are discharged, your primary care physician will handle any further medical issues. Please note that NO REFILLS for any discharge medications will be authorized once you are discharged, as it is imperative that you return to your primary care physician (or establish a relationship with a primary care physician if you do not have one) for your post hospital discharge needs so that they can reassess your need for medications and monitor your lab values.  Time coordinating discharge: 40 minutes  SIGNED:  Nilda Fendt, MD, PhD 03/17/2024, 9:55 AM

## 2024-03-16 NOTE — Plan of Care (Signed)
  Problem: Education: Goal: Knowledge of General Education information will improve Description: Including pain rating scale, medication(s)/side effects and non-pharmacologic comfort measures Outcome: Progressing   Problem: Activity: Goal: Risk for activity intolerance will decrease Outcome: Progressing   Problem: Coping: Goal: Level of anxiety will decrease Outcome: Progressing   Problem: Pain Managment: Goal: General experience of comfort will improve and/or be controlled Outcome: Progressing   Problem: Nutrition: Goal: Adequate nutrition will be maintained Outcome: Not Applicable

## 2024-03-16 NOTE — Progress Notes (Signed)
 Progress Note     Subjective: Patient continuing to have abdominal pain of the left lower abdomin. Feels that it has improved since placement of NGT. Denies nausea and vomiting. Has not had a BM or flatulence. Currently NPO.  ROS  All negative with the exception of above.  Objective: Vital signs in last 24 hours: Temp:  [97.8 F (36.6 C)-98.8 F (37.1 C)] 98.4 F (36.9 C) (09/26 0757) Pulse Rate:  [89-108] 96 (09/26 0757) Resp:  [16-20] 18 (09/26 0757) BP: (135-178)/(65-91) 152/76 (09/26 0757) SpO2:  [92 %-96 %] 95 % (09/26 0757) Last BM Date : 03/11/24  Intake/Output from previous day: 09/25 0701 - 09/26 0700 In: 1502.3 [I.V.:1311.7; NG/GT:90; IV Piggyback:100.6] Out: 2700 [Urine:300; Emesis/NG output:2400] Intake/Output this shift: No intake/output data recorded.  PE: General: Pleasant male who is laying in bed in NAD. HEENT: Head is normocephalic, atraumatic. NGT in place connected to cannister. Heart: HR normal Lungs: Respiratory effort nonlabored Abd: Soft with distention. Tenderness to palpation of left lower quadrant. No guarding. MS: RUE in sling Skin: Warm and dry Psych: A&Ox3 with an appropriate affect.    Lab Results:  Recent Labs    03/15/24 1150 03/16/24 0539  WBC 7.9 5.6  HGB 11.7* 11.4*  HCT 34.8* 35.1*  PLT 318 324   BMET Recent Labs    03/15/24 1150 03/16/24 0539  NA 132* 135  K 3.9 3.8  CL 95* 95*  CO2 26 28  GLUCOSE 133* 115*  BUN 14 16  CREATININE 0.67 0.87  CALCIUM  9.2 8.6*   PT/INR Recent Labs    03/16/24 0539  LABPROT 14.0  INR 1.0   CMP     Component Value Date/Time   NA 135 03/16/2024 0539   K 3.8 03/16/2024 0539   CL 95 (L) 03/16/2024 0539   CO2 28 03/16/2024 0539   GLUCOSE 115 (H) 03/16/2024 0539   BUN 16 03/16/2024 0539   CREATININE 0.87 03/16/2024 0539   CALCIUM  8.6 (L) 03/16/2024 0539   PROT 5.9 (L) 03/16/2024 0539   ALBUMIN 2.6 (L) 03/16/2024 0539   AST 19 03/16/2024 0539   ALT 18 03/16/2024 0539    ALKPHOS 46 03/16/2024 0539   BILITOT 1.2 03/16/2024 0539   GFRNONAA >60 03/16/2024 0539   GFRAA >90 02/27/2013 0740   Lipase     Component Value Date/Time   LIPASE 21 03/15/2024 1150       Studies/Results: DG Abd Portable 1V-Small Bowel Protocol-Position Verification Result Date: 03/15/2024 CLINICAL DATA:  NG tube EXAM: PORTABLE ABDOMEN - 1 VIEW COMPARISON:  None Available. FINDINGS: Nasogastric tube tip is in the proximal body of the stomach. Distended loops of small bowel in the mid abdomen are again noted. IMPRESSION: Nasogastric tube tip is in the proximal body of the stomach. Electronically Signed   By: Greig Pique M.D.   On: 03/15/2024 18:43   CT ABDOMEN PELVIS W CONTRAST Result Date: 03/15/2024 EXAM: CT ABDOMEN AND PELVIS WITH CONTRAST 03/15/2024 01:13:00 PM TECHNIQUE: CT of the abdomen and pelvis was performed with the administration of 100 mL of iohexol  (OMNIPAQUE ) 300 MG/ML solution. Multiplanar reformatted images are provided for review. Automated exposure control, iterative reconstruction, and/or weight-based adjustment of the mA/kV was utilized to reduce the radiation dose to as low as reasonably achievable. COMPARISON: CT abdomen and pelvis 02/27/2013. CLINICAL HISTORY: LLQ abdominal pain for 3 days. Shoulder surgery on Monday. Has not had bowel movement since. Wearing pain pump from shoulder surgery. FINDINGS: LOWER CHEST: Atelectasis in the  posterior right lower lobe. Pneumonia is difficult to exclude. LIVER: Hepatic steatosis. GALLBLADDER AND BILE DUCTS: Gallbladder is unremarkable. No biliary ductal dilatation. SPLEEN: No acute abnormality. PANCREAS: No acute abnormality. ADRENAL GLANDS: No acute abnormality. KIDNEYS, URETERS AND BLADDER: No stones in the kidneys or ureters. No hydronephrosis. No perinephric or periureteral stranding. Urinary bladder is unremarkable. GI AND BOWEL: Postoperative change of colectomy with ileo-anal anastomosis and pouch formation. Diffuse dilation  of the small bowel measuring up to 4.5 cm in diameter. Mild wall thickening and mucosal hyperenhancement of the distal ileum. There is relatively abrupt tapering at the thickened ileal segment upstream from the anastomosis (series 5 image 38). Mucosal thickening and Hyperenhancement with adjacent free fluid about the duodenum suggesting duodenitis. PERITONEUM AND RETROPERITONEUM: Vascular congestion and free fluid in the central mesentery. No free air. VASCULATURE: Aorta is normal in caliber. LYMPH NODES: No lymphadenopathy. REPRODUCTIVE ORGANS: No acute abnormality. BONES AND SOFT TISSUES: Right hip arthroplasty. No acute osseous abnormality. No focal soft tissue abnormality. IMPRESSION: 1. Postoperative changes of colectomy with ileoanal anastomosis and pouch formation. 2. Dilated small bowel with relatively abrupt tapering at a segment of inflamed ilium in the right lower quadrant upstream from the anastomosis. Findings suggest early or partial small bowel obstruction. 3. Mucosal thickening and Hyperenhancement with adjacent free fluid about the duodenum suggesting duodenitis. 4. Atelectasis in the posterior right lower lobe. Pneumonia is difficult to exclude. 5. Hepatic steatosis. Electronically signed by: Norman Gatlin MD 03/15/2024 01:49 PM EDT RP Workstation: [id number]    Anti-infectives: Anti-infectives (From admission, onward)    None        Assessment/Plan [name]. is an 63 y.o. male with history of UC s/p TAC and ileoanal anastomosis presenting with SBO  - CT Abdomen Pelvis W contrast 9/25 showed postoperative changes of colectomy with ileoanal anastomosis and pouch formation. Dilated small bowel with relatively abrupt tapering at a segment of inflamed ilium in the right lower quadrant upstream from the anastomosis. Findings suggest early or partial small bowel obstruction. Mucosal thickening and Hyperenhancement with adjacent free fluid about the duodenum suggesting  duodenitis. - Vitals stable. Afebrile - WBC 5.6; HGB 11.4 - SBO Protocol ongoing. 8 hour delay film ordered. - Continue with NPO and maintain NGT. Total output recorded 2400 mL - Will continue with nonoperative management at this time. Should there be no improvement, may need an operation this admission.  - May benefit from GI consult given CT imaging findings and history of IBD.  - Will continue to follow.  FEN: NPO; NGT; IVF per primary team VTE: SCDs, ok for DVT ppx from surgical perspective  ID: None currently    LOS: 1 day   I reviewed EDP notes, specialist notes, nursing notes, last 24 h vitals and pain scores, last 48 h intake and output, last 24 h labs and trends, and last 24 h imaging results.  This care required moderate level of medical decision making.    Marjorie Carlyon Favre, [facility] 03/16/2024, 8:01 AM Please see Amion for pager number during day hours 7:00am-4:30pm

## 2024-03-16 NOTE — Progress Notes (Signed)
 PROGRESS NOTE  [name]. FMW:[id number] DOB: [birth year] DOA: 03/15/2024 PCP: Frederik Charleston, MD   LOS: 1 day   Brief Narrative / Interim history: 63 year old male with history of Crohn's disease status post total colectomy and ileal pouch, complicated by ileal pouchitis, now controlled with guselkumab and supposed to have follow-up with [facility] October 2025, iron deficiency anemia, osteoarthritis status post recent right shoulder surgery on Monday comes into the hospital with abdominal pain, left lower quadrant, inability to have any bowel movements or passing gas since his surgery.  On admission, he was found to have a small bowel obstruction, general surgery was consulted and he was admitted to the hospital for  Subjective / 24h Interval events: He states that he is doing better, has less abdominal pain, no nausea.  Has an NG tube in place.  Tells me he passed a little bit of gas earlier this morning  Assesement and Plan: Principal problem Partial small bowel obstruction -patient with complex GI history.  Management per general surgery who is following.  Continue NG tube, n.p.o., IV fluids and close monitoring  Active problems History of ulcerative colitis, total colectomy, ileal pouch -apparently controlled as an outpatient on guselkumab.  Has follow-up with GI in October 2024 and at [facility]  Recent right shoulder surgery -on bupivacaine  pump, this was discontinued by patient and family at the indication of his surgeon over the phone.  This would have been an outpatient procedure regardless  Essential hypertension-hold oral agents due to n.p.o.  On hydralazine  prn  Scheduled Meds:  bupivacaine  ON-Q pain pump  1 each Other Once   sodium chloride  flush  3 mL Intravenous Q12H   Continuous Infusions:  lactated ringers  125 mL/hr at 03/15/24 2257   PRN Meds:.acetaminophen  **OR** acetaminophen , hydrALAZINE , HYDROmorphone  (DILAUDID ) injection, naLOXone  (NARCAN )  injection  Current  Outpatient Medications  Medication Instructions   albuterol (VENTOLIN HFA) 108 (90 Base) MCG/ACT inhaler 1 puff   aspirin EC 81 mg, 2 times daily   chlorthalidone  (HYGROTON ) 25 mg, Daily   cycloSPORINE (RESTASIS) 0.05 % ophthalmic emulsion Place 1 drop into both eyes in the morning and at bedtime.   ferrous sulfate 325 (65 FE) MG tablet Oral   fluticasone (FLONASE) 50 MCG/ACT nasal spray 2 sprays, Each Nare, As needed   Guselkumab 400 mg, Every 30 days   methocarbamol (ROBAXIN) 500 mg, 4 times daily PRN   Multiple Vitamin (ONE DAILY) tablet 1 tablet, Oral, Daily   ondansetron  (ZOFRAN -ODT) 4 mg, Every 6 hours PRN   oxyCODONE  (OXY IR/ROXICODONE ) 5-10 mg, Every 4 hours PRN   rosuvastatin  (CRESTOR ) 10 mg, Oral, Daily   valACYclovir (VALTREX) 1,000 mg, 2 times daily    Diet Orders (From admission, onward)     Start     Ordered   03/15/24 2326  Diet NPO time specified  Diet effective now        03/15/24 2325            DVT prophylaxis: SCDs Start: 03/15/24 2326 Place TED hose Start: 03/15/24 2326   Lab Results  Component Value Date   PLT 324 03/16/2024      Code Status: Full Code  Family Communication: No family at bedside  Status is: Inpatient Remains inpatient appropriate because: Severity of illness   Level of care: Med-Surg  Consultants:  General surgery  Objective: Vitals:   03/16/24 0248 03/16/24 0447 03/16/24 0515 03/16/24 0757  BP: 137/72  139/65 (!) 152/76  Pulse: 95  89 96  Resp: 20  17 18   Temp:   97.8 F (36.6 C) 98.4 F (36.9 C)  TempSrc:   Oral Oral  SpO2: 93%  94% 95%  Height:  6' (1.829 m)      Intake/Output Summary (Last 24 hours) at 03/16/2024 1109 Last data filed at 03/16/2024 0800 Gross per 24 hour  Intake 1502.25 ml  Output 2700 ml  Net -1197.75 ml   Wt Readings from Last 3 Encounters:  01/02/21 94.3 kg  05/07/19 98 kg  04/09/19 99.8 kg    Examination:  Constitutional: NAD Eyes: no scleral icterus ENMT: Mucous membranes  are moist.  Neck: normal, supple Respiratory: clear to auscultation bilaterally, no wheezing, no crackles. Normal respiratory effort. Cardiovascular: Regular rate and rhythm, no murmurs / rubs / gallops. No LE edema.  Abdomen: non distended, no tenderness. Bowel sounds positive.  Musculoskeletal: no clubbing / cyanosis.   Data Reviewed: I have independently reviewed following labs and imaging studies   CBC Recent Labs  Lab 03/15/24 1150 03/16/24 0539  WBC 7.9 5.6  HGB 11.7* 11.4*  HCT 34.8* 35.1*  PLT 318 324  MCV 87.2 88.0  MCH 29.3 28.6  MCHC 33.6 32.5  RDW 16.2* 16.1*    Recent Labs  Lab 03/15/24 1150 03/16/24 0539  NA 132* 135  K 3.9 3.8  CL 95* 95*  CO2 26 28  GLUCOSE 133* 115*  BUN 14 16  CREATININE 0.67 0.87  CALCIUM  9.2 8.6*  AST 25 19  ALT 20 18  ALKPHOS 56 46  BILITOT 0.7 1.2  ALBUMIN 3.5 2.6*  INR  --  1.0    ------------------------------------------------------------------------------------------------------------------ No results for input(s): CHOL, HDL, LDLCALC, TRIG, CHOLHDL, LDLDIRECT in the last 72 hours.  No results found for: HGBA1C ------------------------------------------------------------------------------------------------------------------ No results for input(s): TSH, T4TOTAL, T3FREE, THYROIDAB in the last 72 hours.  Invalid input(s): FREET3  Cardiac Enzymes No results for input(s): CKMB, TROPONINI, MYOGLOBIN in the last 168 hours.  Invalid input(s): CK ------------------------------------------------------------------------------------------------------------------ No results found for: BNP  CBG: No results for input(s): GLUCAP in the last 168 hours.  No results found for this or any previous visit (from the past 240 hours).   Radiology Studies: DG Abd Portable 1V-Small Bowel Obstruction Protocol-initial, 8 hr delay Result Date: 03/16/2024 CLINICAL DATA:  Small bowel obstruction. EXAM:  PORTABLE ABDOMEN - 1 VIEW COMPARISON:  03/15/2024 as well as CT 03/15/2024 FINDINGS: Sutures suture line over the bowel in the right pelvis. Nasogastric tube has tip over the stomach in the left upper quadrant. Bowel gas pattern continues demonstrate several air-filled dilated small bowel loops without significant change. Remainder of the exam is unchanged. IMPRESSION: Stable compatible with suggesting persistent early/partial small bowel obstruction. Electronically Signed   By: Toribio Agreste M.D.   On: 03/16/2024 08:43   DG Abd Portable 1V-Small Bowel Protocol-Position Verification Result Date: 03/15/2024 CLINICAL DATA:  NG tube EXAM: PORTABLE ABDOMEN - 1 VIEW COMPARISON:  None Available. FINDINGS: Nasogastric tube tip is in the proximal body of the stomach. Distended loops of small bowel in the mid abdomen are again noted. IMPRESSION: Nasogastric tube tip is in the proximal body of the stomach. Electronically Signed   By: Greig Pique M.D.   On: 03/15/2024 18:43   CT ABDOMEN PELVIS W CONTRAST Result Date: 03/15/2024 EXAM: CT ABDOMEN AND PELVIS WITH CONTRAST 03/15/2024 01:13:00 PM TECHNIQUE: CT of the abdomen and pelvis was performed with the administration of 100 mL of iohexol  (OMNIPAQUE ) 300 MG/ML solution. Multiplanar reformatted images are provided  for review. Automated exposure control, iterative reconstruction, and/or weight-based adjustment of the mA/kV was utilized to reduce the radiation dose to as low as reasonably achievable. COMPARISON: CT abdomen and pelvis 02/27/2013. CLINICAL HISTORY: LLQ abdominal pain for 3 days. Shoulder surgery on Monday. Has not had bowel movement since. Wearing pain pump from shoulder surgery. FINDINGS: LOWER CHEST: Atelectasis in the posterior right lower lobe. Pneumonia is difficult to exclude. LIVER: Hepatic steatosis. GALLBLADDER AND BILE DUCTS: Gallbladder is unremarkable. No biliary ductal dilatation. SPLEEN: No acute abnormality. PANCREAS: No acute abnormality.  ADRENAL GLANDS: No acute abnormality. KIDNEYS, URETERS AND BLADDER: No stones in the kidneys or ureters. No hydronephrosis. No perinephric or periureteral stranding. Urinary bladder is unremarkable. GI AND BOWEL: Postoperative change of colectomy with ileo-anal anastomosis and pouch formation. Diffuse dilation of the small bowel measuring up to 4.5 cm in diameter. Mild wall thickening and mucosal hyperenhancement of the distal ileum. There is relatively abrupt tapering at the thickened ileal segment upstream from the anastomosis (series 5 image 38). Mucosal thickening and Hyperenhancement with adjacent free fluid about the duodenum suggesting duodenitis. PERITONEUM AND RETROPERITONEUM: Vascular congestion and free fluid in the central mesentery. No free air. VASCULATURE: Aorta is normal in caliber. LYMPH NODES: No lymphadenopathy. REPRODUCTIVE ORGANS: No acute abnormality. BONES AND SOFT TISSUES: Right hip arthroplasty. No acute osseous abnormality. No focal soft tissue abnormality. IMPRESSION: 1. Postoperative changes of colectomy with ileoanal anastomosis and pouch formation. 2. Dilated small bowel with relatively abrupt tapering at a segment of inflamed ilium in the right lower quadrant upstream from the anastomosis. Findings suggest early or partial small bowel obstruction. 3. Mucosal thickening and Hyperenhancement with adjacent free fluid about the duodenum suggesting duodenitis. 4. Atelectasis in the posterior right lower lobe. Pneumonia is difficult to exclude. 5. Hepatic steatosis. Electronically signed by: Norman Gatlin MD 03/15/2024 01:49 PM EDT RP Workstation: [id number]     Nilda Fendt, MD, PhD [facility]  Between 7 am - 7 pm I am available, please contact me via Amion (for emergencies) or Securechat (non urgent messages)  Between 7 pm - 7 am I am not available, please contact night coverage MD/APP via Amion

## 2024-03-16 NOTE — Plan of Care (Signed)
  Problem: Coping: Goal: Level of anxiety will decrease Outcome: Progressing   Problem: Pain Managment: Goal: General experience of comfort will improve and/or be controlled Outcome: Progressing   Problem: Skin Integrity: Goal: Risk for impaired skin integrity will decrease Outcome: Progressing

## 2024-03-16 NOTE — Progress Notes (Signed)
 Patient arrived to unit minimal assist to the bed from stretcher. Left nare NG tube in place, right shoulder sling, and right shoulder pain pump installed. Patient oriented to the room and no concerns noted at this time.

## 2024-03-17 ENCOUNTER — Inpatient Hospital Stay (HOSPITAL_COMMUNITY)

## 2024-03-17 DIAGNOSIS — K56600 Partial intestinal obstruction, unspecified as to cause: Secondary | ICD-10-CM | POA: Diagnosis not present

## 2024-03-17 LAB — CBC
HCT: 32.5 % — ABNORMAL LOW (ref 39.0–52.0)
Hemoglobin: 10.4 g/dL — ABNORMAL LOW (ref 13.0–17.0)
MCH: 28.3 pg (ref 26.0–34.0)
MCHC: 32 g/dL (ref 30.0–36.0)
MCV: 88.6 fL (ref 80.0–100.0)
Platelets: 337 K/uL (ref 150–400)
RBC: 3.67 MIL/uL — ABNORMAL LOW (ref 4.22–5.81)
RDW: 16.2 % — ABNORMAL HIGH (ref 11.5–15.5)
WBC: 4.1 K/uL (ref 4.0–10.5)
nRBC: 0.5 % — ABNORMAL HIGH (ref 0.0–0.2)

## 2024-03-17 LAB — COMPREHENSIVE METABOLIC PANEL WITH GFR
ALT: 17 U/L (ref 0–44)
AST: 16 U/L (ref 15–41)
Albumin: 2.4 g/dL — ABNORMAL LOW (ref 3.5–5.0)
Alkaline Phosphatase: 43 U/L (ref 38–126)
Anion gap: 14 (ref 5–15)
BUN: 18 mg/dL (ref 8–23)
CO2: 28 mmol/L (ref 22–32)
Calcium: 8.2 mg/dL — ABNORMAL LOW (ref 8.9–10.3)
Chloride: 95 mmol/L — ABNORMAL LOW (ref 98–111)
Creatinine, Ser: 1.02 mg/dL (ref 0.61–1.24)
GFR, Estimated: >60 mL/min (ref >60)
Glucose, Bld: 95 mg/dL (ref 70–99)
Potassium: 3.3 mmol/L — ABNORMAL LOW (ref 3.5–5.1)
Sodium: 137 mmol/L (ref 135–145)
Total Bilirubin: 1.1 mg/dL (ref 0.0–1.2)
Total Protein: 5.6 g/dL — ABNORMAL LOW (ref 6.5–8.1)

## 2024-03-17 LAB — MAGNESIUM: Magnesium: 2.2 mg/dL (ref 1.7–2.4)

## 2024-03-17 NOTE — Progress Notes (Signed)
 Progress Note     Subjective: Patient reports that his abdominal pain has improved. Still some discomfort of the left abdomen. Reports distention has also improved. Denies nausea/vomiting. Had BM this morning that was loose. Denies bloody stool. Passing flatus.  ROS  All negative with the exception of above.  Objective: Vital signs in last 24 hours: Temp:  [98.1 F (36.7 C)-99 F (37.2 C)] 98.6 F (37 C) (09/27 0846) Pulse Rate:  [86-98] 98 (09/27 0846) Resp:  [18-19] 18 (09/27 0846) BP: (128-147)/(67-76) 128/67 (09/27 0846) SpO2:  [90 %-94 %] 94 % (09/27 0846) Last BM Date : 03/16/24  Intake/Output from previous day: 09/26 0701 - 09/27 0700 In: 0  Out: 1300 [Urine:100; Emesis/NG output:1200] Intake/Output this shift: No intake/output data recorded.  PE: General: Pleasant male who is laying in bed in NAD. HEENT: Head is normocephalic, atraumatic. NGT in place connected to cannister. ~75 mL of brown/greenish fluid in cannister. Heart: HR normal Lungs: Respiratory effort nonlabored. Abd: Soft with distention (distention improved). Tenderness to palpation of left lower quadrant. No guarding. Midline scar present. MS: RUE in sling Skin: Warm and dry Psych: A&Ox3 with an appropriate affect.    Lab Results:  Recent Labs    03/16/24 0539 03/17/24 0537  WBC 5.6 4.1  HGB 11.4* 10.4*  HCT 35.1* 32.5*  PLT 324 337   BMET Recent Labs    03/16/24 0539 03/17/24 0537  NA 135 137  K 3.8 3.3*  CL 95* 95*  CO2 28 28  GLUCOSE 115* 95  BUN 16 18  CREATININE 0.87 1.02  CALCIUM  8.6* 8.2*   PT/INR Recent Labs    03/16/24 0539  LABPROT 14.0  INR 1.0   CMP     Component Value Date/Time   NA 137 03/17/2024 0537   K 3.3 (L) 03/17/2024 0537   CL 95 (L) 03/17/2024 0537   CO2 28 03/17/2024 0537   GLUCOSE 95 03/17/2024 0537   BUN 18 03/17/2024 0537   CREATININE 1.02 03/17/2024 0537   CALCIUM  8.2 (L) 03/17/2024 0537   PROT 5.6 (L) 03/17/2024 0537   ALBUMIN 2.4  (L) 03/17/2024 0537   AST 16 03/17/2024 0537   ALT 17 03/17/2024 0537   ALKPHOS 43 03/17/2024 0537   BILITOT 1.1 03/17/2024 0537   GFRNONAA >60 03/17/2024 0537   GFRAA >90 02/27/2013 0740   Lipase     Component Value Date/Time   LIPASE 21 03/15/2024 1150       Studies/Results: DG Abd Portable 1V Result Date: 03/17/2024 CLINICAL DATA:  [id number] SBO (small bowel obstruction) (HCC) [id number] EXAM: PORTABLE ABDOMEN - 1 VIEW COMPARISON:  KUB, most recently 03/16/2024.  CT AP, 03/15/2024. FINDINGS: Support lines; stable positioning of enteric decompression tube, projecting over the expected location of the stomach. Persistent, diffuse air distention of small bowel, similar appearance to recent comparison. Surgical staple line and clips at the pelvis. No evidence of free intraperitoneal air. RIGHT hip arthroplasty. No interval osseous abnormality. IMPRESSION: 1. Stable lines and tubes. 2. Persistent, diffuse distention of the small bowel similar to recent comparison. No evidence of free intraperitoneal air. Electronically Signed   By: Thom Hall M.D.   On: 03/17/2024 08:23   DG Abd Portable 1V-Small Bowel Obstruction Protocol-initial, 8 hr delay Result Date: 03/16/2024 CLINICAL DATA:  Small bowel obstruction. EXAM: PORTABLE ABDOMEN - 1 VIEW COMPARISON:  03/15/2024 as well as CT 03/15/2024 FINDINGS: Sutures suture line over the bowel in the right pelvis. Nasogastric tube has tip over the  stomach in the left upper quadrant. Bowel gas pattern continues demonstrate several air-filled dilated small bowel loops without significant change. Remainder of the exam is unchanged. IMPRESSION: Stable compatible with suggesting persistent early/partial small bowel obstruction. Electronically Signed   By: Toribio Agreste M.D.   On: 03/16/2024 08:43   DG Abd Portable 1V-Small Bowel Protocol-Position Verification Result Date: 03/15/2024 CLINICAL DATA:  NG tube EXAM: PORTABLE ABDOMEN - 1 VIEW COMPARISON:  None Available.  FINDINGS: Nasogastric tube tip is in the proximal body of the stomach. Distended loops of small bowel in the mid abdomen are again noted. IMPRESSION: Nasogastric tube tip is in the proximal body of the stomach. Electronically Signed   By: Greig Pique M.D.   On: 03/15/2024 18:43   CT ABDOMEN PELVIS W CONTRAST Result Date: 03/15/2024 EXAM: CT ABDOMEN AND PELVIS WITH CONTRAST 03/15/2024 01:13:00 PM TECHNIQUE: CT of the abdomen and pelvis was performed with the administration of 100 mL of iohexol  (OMNIPAQUE ) 300 MG/ML solution. Multiplanar reformatted images are provided for review. Automated exposure control, iterative reconstruction, and/or weight-based adjustment of the mA/kV was utilized to reduce the radiation dose to as low as reasonably achievable. COMPARISON: CT abdomen and pelvis 02/27/2013. CLINICAL HISTORY: LLQ abdominal pain for 3 days. Shoulder surgery on Monday. Has not had bowel movement since. Wearing pain pump from shoulder surgery. FINDINGS: LOWER CHEST: Atelectasis in the posterior right lower lobe. Pneumonia is difficult to exclude. LIVER: Hepatic steatosis. GALLBLADDER AND BILE DUCTS: Gallbladder is unremarkable. No biliary ductal dilatation. SPLEEN: No acute abnormality. PANCREAS: No acute abnormality. ADRENAL GLANDS: No acute abnormality. KIDNEYS, URETERS AND BLADDER: No stones in the kidneys or ureters. No hydronephrosis. No perinephric or periureteral stranding. Urinary bladder is unremarkable. GI AND BOWEL: Postoperative change of colectomy with ileo-anal anastomosis and pouch formation. Diffuse dilation of the small bowel measuring up to 4.5 cm in diameter. Mild wall thickening and mucosal hyperenhancement of the distal ileum. There is relatively abrupt tapering at the thickened ileal segment upstream from the anastomosis (series 5 image 38). Mucosal thickening and Hyperenhancement with adjacent free fluid about the duodenum suggesting duodenitis. PERITONEUM AND RETROPERITONEUM: Vascular  congestion and free fluid in the central mesentery. No free air. VASCULATURE: Aorta is normal in caliber. LYMPH NODES: No lymphadenopathy. REPRODUCTIVE ORGANS: No acute abnormality. BONES AND SOFT TISSUES: Right hip arthroplasty. No acute osseous abnormality. No focal soft tissue abnormality. IMPRESSION: 1. Postoperative changes of colectomy with ileoanal anastomosis and pouch formation. 2. Dilated small bowel with relatively abrupt tapering at a segment of inflamed ilium in the right lower quadrant upstream from the anastomosis. Findings suggest early or partial small bowel obstruction. 3. Mucosal thickening and Hyperenhancement with adjacent free fluid about the duodenum suggesting duodenitis. 4. Atelectasis in the posterior right lower lobe. Pneumonia is difficult to exclude. 5. Hepatic steatosis. Electronically signed by: Norman Gatlin MD 03/15/2024 01:49 PM EDT RP Workstation: [id number]    Anti-infectives: Anti-infectives (From admission, onward)    None        Assessment/Plan [name]. is an 63 y.o. male with history of UC s/p TAC and ileoanal anastomosis presenting with SBO  - CT Abdomen Pelvis W contrast 9/25 showed postoperative changes of colectomy with ileoanal anastomosis and pouch formation. Dilated small bowel with relatively abrupt tapering at a segment of inflamed ilium in the right lower quadrant upstream from the anastomosis. Findings suggest early or partial small bowel obstruction. Mucosal thickening and Hyperenhancement with adjacent free fluid about the duodenum suggesting duodenitis. - 8 hr  film was stable compatible with suggesting persistent early/partial small bowel obstruction. - 24 hr film showing persistent diffuse air distention of small bowel, similar appearance to recent comparison. No evidence of free intraperitoneal air. - Afebrile - WBC 4.1; HGB 10.4 - Continue with NPO and maintain NGT. Total output recorded 1200 mL 9/26-9/27. - May benefit from GI  consult given CT imaging findings and history of IBD.  - Patient has a colorectal surgeon at [facility]. Patient desires to transfer. Transfer of care is in process. This Will continue to follow.   FEN: NPO; NGT; IVF per primary team VTE: SCDs, ok for DVT ppx from surgical perspective  ID: None currently    LOS: 2 days   I reviewed specialist notes, hospitalist notes, nursing notes, last 24 h vitals and pain scores, last 48 h intake and output, last 24 h labs and trends, and last 24 h imaging results.  This care required moderate level of medical decision making.    Marjorie Carlyon Favre, [facility] 03/17/2024, 8:48 AM Please see Amion for pager number during day hours 7:00am-4:30pm

## 2024-03-17 NOTE — Plan of Care (Signed)
  Problem: Pain Managment: Goal: General experience of comfort will improve and/or be controlled Outcome: Progressing   Problem: Safety: Goal: Ability to remain free from injury will improve Outcome: Progressing

## 2024-03-17 NOTE — Progress Notes (Signed)
 PROGRESS NOTE  [name]. FMW:[id number] DOB: [birth year] DOA: 03/15/2024 PCP: Frederik Charleston, MD   LOS: 2 days   Brief Narrative / Interim history: 63 year old male with history of Crohn's disease status post total colectomy and ileal pouch, complicated by ileal pouchitis, now controlled with guselkumab and supposed to have follow-up with [facility] GI October 2025, iron deficiency anemia, osteoarthritis status post recent right shoulder surgery on Monday comes into the hospital with abdominal pain, left lower quadrant, inability to have any bowel movements or passing gas since his surgery.  On admission, he was found to have a small bowel obstruction, general surgery was consulted and he was admitted to the hospital for  Subjective / 24h Interval events: Passing some gas, had a liquid bowel movement this morning.  Still complains of abdominal pain but slightly better  NG output still elevated at 1200 cc over the last 24 hours  He was accepted in transfer @ [facility], awaiting bed availability  Assesement and Plan: Principal problem Partial small bowel obstruction -patient with complex GI history.  Management per general surgery who is following.  Continue NG tube, n.p.o., IV fluids and close monitoring - Abdominal x-ray this morning fairly unchanged  Active problems History of ulcerative colitis, total colectomy, ileal pouch -apparently controlled as an outpatient on guselkumab.  Has follow-up with GI in October 2024 and at [facility]  Recent right shoulder surgery -on bupivacaine  pump, discontinued yesterday  Essential hypertension-hold oral agents due to n.p.o.  On hydralazine  prn.  Blood pressure acceptable  Scheduled Meds:  enoxaparin  (LOVENOX ) injection  40 mg Subcutaneous Q24H   sodium chloride  flush  3 mL Intravenous Q12H   Continuous Infusions:   PRN Meds:.acetaminophen  **OR** acetaminophen , hydrALAZINE , HYDROmorphone  (DILAUDID ) injection, naLOXone  (NARCAN )   injection  Current Outpatient Medications  Medication Instructions   albuterol (VENTOLIN HFA) 108 (90 Base) MCG/ACT inhaler 1 puff, Inhalation, Every 6 hours PRN   aspirin EC 81 mg, Oral, 2 times daily   ciprofloxacin (CIPRO) 500 mg, Oral, 2 times daily   cycloSPORINE (RESTASIS) 0.05 % ophthalmic emulsion Place 1 drop into both eyes in the morning and at bedtime.   fluticasone (FLONASE) 50 MCG/ACT nasal spray 2 sprays, Daily   Guselkumab 400 mg, Subcutaneous, Every 30 days   methocarbamol (ROBAXIN) 500 mg, Oral, 4 times daily PRN   Multiple Vitamin (ONE DAILY) tablet 1 tablet, Daily   ondansetron  (ZOFRAN -ODT) 4 mg, Oral, Every 6 hours PRN   oxyCODONE  (OXY IR/ROXICODONE ) 5-10 mg, Oral, Every 4 hours PRN   rosuvastatin  (CRESTOR ) 10 mg, Oral, Daily    Diet Orders (From admission, onward)     Start     Ordered   03/15/24 2326  Diet NPO time specified  Diet effective now        03/15/24 2325            DVT prophylaxis: enoxaparin  (LOVENOX ) injection 40 mg Start: 03/17/24 1000 SCDs Start: 03/15/24 2326 Place TED hose Start: 03/15/24 2326   Lab Results  Component Value Date   PLT 337 03/17/2024      Code Status: Full Code  Family Communication: No family at bedside  Status is: Inpatient Remains inpatient appropriate because: Severity of illness   Level of care: Med-Surg  Consultants:  General surgery  Objective: Vitals:   03/16/24 1629 03/16/24 2009 03/17/24 0434 03/17/24 0846  BP: (!) 147/76 (!) 140/69 (!) 140/69 128/67  Pulse: 96 93 86 98  Resp: 18 19 18 18   Temp: 98.1 F (  36.7 C) 99 F (37.2 C) 98.8 F (37.1 C) 98.6 F (37 C)  TempSrc: Oral Oral Oral Oral  SpO2: 90% 93% 93% 94%  Height:        Intake/Output Summary (Last 24 hours) at 03/17/2024 0953 Last data filed at 03/17/2024 0700 Gross per 24 hour  Intake 0 ml  Output 1300 ml  Net -1300 ml   Wt Readings from Last 3 Encounters:  01/02/21 94.3 kg  05/07/19 98 kg  04/09/19 99.8 kg     Examination:  Constitutional: NAD Eyes: lids and conjunctivae normal, no scleral icterus ENMT: mmm Neck: normal, supple Respiratory: clear to auscultation bilaterally, no wheezing, no crackles. Normal respiratory effort.  Cardiovascular: Regular rate and rhythm, no murmurs / rubs / gallops. No LE edema. Abdomen: soft, mildly tender to palpation  Data Reviewed: I have independently reviewed following labs and imaging studies   CBC Recent Labs  Lab 03/15/24 1150 03/16/24 0539 03/17/24 0537  WBC 7.9 5.6 4.1  HGB 11.7* 11.4* 10.4*  HCT 34.8* 35.1* 32.5*  PLT 318 324 337  MCV 87.2 88.0 88.6  MCH 29.3 28.6 28.3  MCHC 33.6 32.5 32.0  RDW 16.2* 16.1* 16.2*    Recent Labs  Lab 03/15/24 1150 03/16/24 0539 03/17/24 0537  NA 132* 135 137  K 3.9 3.8 3.3*  CL 95* 95* 95*  CO2 26 28 28   GLUCOSE 133* 115* 95  BUN 14 16 18   CREATININE 0.67 0.87 1.02  CALCIUM  9.2 8.6* 8.2*  AST 25 19 16   ALT 20 18 17   ALKPHOS 56 46 43  BILITOT 0.7 1.2 1.1  ALBUMIN 3.5 2.6* 2.4*  MG  --   --  2.2  INR  --  1.0  --     ------------------------------------------------------------------------------------------------------------------ No results for input(s): CHOL, HDL, LDLCALC, TRIG, CHOLHDL, LDLDIRECT in the last 72 hours.  No results found for: HGBA1C ------------------------------------------------------------------------------------------------------------------ No results for input(s): TSH, T4TOTAL, T3FREE, THYROIDAB in the last 72 hours.  Invalid input(s): FREET3  Cardiac Enzymes No results for input(s): CKMB, TROPONINI, MYOGLOBIN in the last 168 hours.  Invalid input(s): CK ------------------------------------------------------------------------------------------------------------------ No results found for: BNP  CBG: No results for input(s): GLUCAP in the last 168 hours.  No results found for this or any previous visit (from the past 240  hours).   Radiology Studies: DG Abd Portable 1V Result Date: 03/17/2024 CLINICAL DATA:  [id number] SBO (small bowel obstruction) (HCC) [id number] EXAM: PORTABLE ABDOMEN - 1 VIEW COMPARISON:  KUB, most recently 03/16/2024.  CT AP, 03/15/2024. FINDINGS: Support lines; stable positioning of enteric decompression tube, projecting over the expected location of the stomach. Persistent, diffuse air distention of small bowel, similar appearance to recent comparison. Surgical staple line and clips at the pelvis. No evidence of free intraperitoneal air. RIGHT hip arthroplasty. No interval osseous abnormality. IMPRESSION: 1. Stable lines and tubes. 2. Persistent, diffuse distention of the small bowel similar to recent comparison. No evidence of free intraperitoneal air. Electronically Signed   By: Thom Hall M.D.   On: 03/17/2024 08:23     Nilda Fendt, MD, PhD [facility]  Between 7 am - 7 pm I am available, please contact me via Amion (for emergencies) or Securechat (non urgent messages)  Between 7 pm - 7 am I am not available, please contact night coverage MD/APP via Amion

## 2024-03-17 NOTE — Plan of Care (Signed)
  Problem: Education: Goal: Knowledge of General Education information will improve Description: Including pain rating scale, medication(s)/side effects and non-pharmacologic comfort measures Outcome: Adequate for Discharge   Problem: Health Behavior/Discharge Planning: Goal: Ability to manage health-related needs will improve Outcome: Adequate for Discharge   Problem: Clinical Measurements: Goal: Ability to maintain clinical measurements within normal limits will improve Outcome: Adequate for Discharge Goal: Will remain free from infection Outcome: Adequate for Discharge Goal: Diagnostic test results will improve Outcome: Adequate for Discharge Goal: Respiratory complications will improve Outcome: Adequate for Discharge Goal: Cardiovascular complication will be avoided Outcome: Adequate for Discharge   Problem: Activity: Goal: Risk for activity intolerance will decrease Outcome: Adequate for Discharge   Problem: Coping: Goal: Level of anxiety will decrease Outcome: Adequate for Discharge   Problem: Elimination: Goal: Will not experience complications related to bowel motility Outcome: Adequate for Discharge Goal: Will not experience complications related to urinary retention Outcome: Adequate for Discharge   Problem: Pain Managment: Goal: General experience of comfort will improve and/or be controlled Outcome: Adequate for Discharge   Problem: Safety: Goal: Ability to remain free from injury will improve Outcome: Adequate for Discharge   Problem: Skin Integrity: Goal: Risk for impaired skin integrity will decrease Outcome: Adequate for Discharge

## 2024-03-23 MED FILL — Ondansetron HCl Inj 4 MG/2ML (2 MG/ML): INTRAMUSCULAR | Qty: 2 | Status: AC

## 2024-03-23 MED FILL — Ondansetron HCl Inj 4 MG/2ML (2 MG/ML): INTRAMUSCULAR | Qty: 2 | Status: CN

## 2024-03-28 NOTE — Discharge Summary (Signed)
 ------------------------------------------------------------------------------- Attestation signed by Cy Marcheta Brochure, MD at 03/28/2024  1:39 PM I personally interviewed and examined this patient. The above note includes my confirmed and independent findings. This interaction included face-to-face counseling of the patient about the proposed care plan and review of records. All questions were discussed and answered.   -------------------------------------------------------------------------------  Discharge Diagnoses Principal Problem:   Small bowel obstruction, partial    (CMD) Active Problems:   Crohn's disease with complication (HCC)   Complications of intestinal pouch (HCC)   Acute pain of right shoulder   S/P shoulder replacement, right   Iron deficiency anemia due to chronic blood loss Resolved Problems:   * No resolved hospital problems. [facility] [name] [name]. is a 63 y.o. 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 IBD is now controlled with guselkumab. Other medical history includes iron deficiency anemia and osteoarthritis s/p recent R shoulder surgery on Monday. He re-presented to [facility] following his discharge for shoulder surgery with concerns of nausea and was admitted for possible SBO. Patient has historically followed with Dr. Brochure and requested transfer to [facility] for continuity of care. He continued with NGT decompression and required TPN while awaiting adequate return of bowel function. BE obtained which showed area of narrowing at top of pouch but no anastomotic stricture or pouch dysfunction. He also had pouchoscopy which confirmed no anastomotic stricture and mild pouch inlet ulceration (marked improvement from previous 4/25). 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 All of this felt to be multifactorial from narcotic  needs following shoulder repair and reactive from positive norovirus. Ultimately he had return of normal bowel function and TPN stopped. Diet advanced and he tolerated without nausea/vomiting. Pain was localized to right shoulder surgery. During admission he reported congested cough; CXR showed right middle lobe consolidation. He denied any shortness of breath or chest pain, no oxygen requirements. Given recent surgery with right sided blocks, he was treated empirically for hospital acquired pneumonia with cefepime x 7 days. He also requested follow up for his chronic anemia that is only marginally improving with Iron transfusions and recent change of Crohn's therapy to Tremfya currently managed by home GI provider (Dr. Darra). Discussed options for management and he would like to meet with hematology to discuss treatment options and this referral was initiated. He remained hemodynamically stable and above transfusion threshold throughout admission. He felt comfortable with discharge home and will follow up with Dr. Brochure in next 2-3 months.    Test Results Pending At Discharge  Pertinent Physical Exam At Time of Discharge Physical Exam HENT:     Mouth/Throat:     Mouth: Mucous membranes are moist.  Cardiovascular:     Rate and Rhythm: Normal rate.  Pulmonary:     Effort: Pulmonary effort is normal.  Abdominal:     General: There is no distension.     Palpations: Abdomen is soft.  Musculoskeletal:     Comments: Right arm sling  Skin:    General: Skin is warm.  Neurological:     Mental Status: He is alert and oriented to person, place, and time.  Psychiatric:        Behavior: Behavior normal.       Discharge Medications     PAUSE taking these medications      Sig Disp Refill Start End  diclofenac 75 mg EC tablet Wait  to take this until your doctor or other care provider tells you to start again. Commonly known as: VOLTAREN  Take 1 tablet (75 mg total) by mouth 2 (two) times a  day.  40 tablet  1         Medications To Continue      Sig Disp Refill Start End  acetaminophen  500 mg tablet Commonly known as: TYLENOL   Take 1 tablet (500 mg total) by mouth every 6 (six) hours as needed for mild pain (1-3).   0     albuterol HFA 90 mcg/actuation inhaler Commonly known as: PROVENTIL HFA;VENTOLIN HFA;PROAIR HFA  Inhale 1 puff every 6 (six) hours as needed for wheezing or shortness of breath.   0     aspirin 81 mg EC tablet  Take 1 tablet (81 mg total) by mouth 2 (two) times a day  60 tablet  0     calcium  carbonate 1250 mg (500 mg calcium ) tablet Commonly known as: OS-CAL  Take 1 tablet by mouth daily.   0     cholecalciferol 5,000 unit (125 mcg) Tab tablet Commonly known as: VITAMIN D3  Take 5,000 Units by mouth daily.   0     ciprofloxacin 500 mg tablet Commonly known as: CIPRO  Take 500 mg by mouth 2 (two) times a day.   0     fluticasone propionate 50 mcg/spray nasal spray Commonly known as: FLONASE  SHAKE LIQUID AND USE 2 SPRAYS IN EACH NOSTRIL EVERY NIGHT  16 g  11     guselkumab 200 mg/2 mL pen Commonly known as: TREMFYA  Inject 400 mg under the skin every 28 days.   0     loperamide 2 mg capsule Commonly known as: IMODIUM  Take 2 mg by mouth 4 (four) times a day as needed.   0     methocarbamoL 500 mg tablet Commonly known as: ROBAXIN  Take 1 tablet (500 mg total) by mouth 4 (four) times a day as needed for muscle spasms.  40 tablet  0     metroNIDAZOLE 500 mg tablet Commonly known as: FLAGYL  Take 500 mg by mouth 2 (two) times a day.   0     One-A-Day Men's Multivitamin 400-20-300 mcg Tab Generic drug: multivit-min-folic acid-vit K-lycop  Take 1 tablet by mouth daily.   0     Restasis 0.05 % ophthalmic emulsion Generic drug: cycloSPORINE  Administer 1 drop into both eyes 2 (two) times a day.   0     rosuvastatin  10 mg tablet Commonly known as: CRESTOR   Take 10 mg by mouth every morning.   0          Stopped Medications    ondansetron  4 mg disintegrating tablet Commonly known as: ZOFRAN -ODT   oxyCODONE  5 mg immediate release tablet Commonly known as: ROXICODONE    Senna Plus 8.6-50 mg per tablet Generic drug: sennosides-docusate sodium       Discharge Orders     Ambulatory referral to Hematology / Oncology     Details:    Reason for Referral: [facility]   Referring for Leukemia?: No   Referring for Transplant or Cell Therapy?: No   Comments: Anemia of chronic disease despite iron transfusions. Hx Crohn's disease, no active GI bleeding found on scope. Would like eval by hemotology   Full Code     Regular diet     Details:    Diet type: Regular       Issues  Requiring Follow-Up Referral to Hematology placed per patient request to follow anemia. Office visit with Dr. Merrilyn electronically requested.   Outpatient Follow-Up Future Appointments  Date Time Provider Department Center  04/05/2024 10:20 AM Tia Rumalda [place], PA-C [facility] ORT [facility] [place]  04/23/2024 10:00 AM Morene Gavel, MD [facility] ORT MPM [facility] [place]  07/10/2024  3:00 PM Cy Marcheta Merrilyn, MD [facility] [place] POSTER [facility] [place] Referral Information   No data to display  Electronically signed by: Delon Magali Sheller, NP 03/28/2024 12:47 PM
